# Patient Record
Sex: Male | Born: 1995 | Race: White | Hispanic: No | Marital: Single | Smoking: Never smoker
Health system: Southern US, Community
[De-identification: ages and names within clinical notes are randomized; demographics above are authoritative.]

## PROBLEM LIST (undated history)

## (undated) DIAGNOSIS — J45909 Unspecified asthma, uncomplicated: Secondary | ICD-10-CM

## (undated) DIAGNOSIS — R569 Unspecified convulsions: Secondary | ICD-10-CM

## (undated) HISTORY — DX: Unspecified asthma, uncomplicated: J45.909

## (undated) HISTORY — DX: Unspecified convulsions: R56.9

---
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
## 2018-06-05 ENCOUNTER — Ambulatory Visit: Payer: Self-pay | Admitting: Allergy and Immunology

## 2018-06-12 ENCOUNTER — Ambulatory Visit: Payer: Self-pay | Admitting: Allergy and Immunology

## 2019-03-06 ENCOUNTER — Encounter: Payer: Self-pay | Admitting: Allergy and Immunology

## 2019-03-06 ENCOUNTER — Ambulatory Visit (INDEPENDENT_AMBULATORY_CARE_PROVIDER_SITE_OTHER): Payer: Commercial Managed Care - PPO | Admitting: Allergy and Immunology

## 2019-03-06 ENCOUNTER — Other Ambulatory Visit: Payer: Self-pay

## 2019-03-06 VITALS — BP 122/80 | HR 100 | Temp 98.0°F | Resp 20 | Ht 70.9 in | Wt 137.0 lb

## 2019-03-06 DIAGNOSIS — J309 Allergic rhinitis, unspecified: Secondary | ICD-10-CM | POA: Diagnosis not present

## 2019-03-06 DIAGNOSIS — J4551 Severe persistent asthma with (acute) exacerbation: Secondary | ICD-10-CM

## 2019-03-06 MED ORDER — IPRATROPIUM-ALBUTEROL 0.5-2.5 (3) MG/3ML IN SOLN: RESPIRATORY_TRACT | 1 refills | Status: DC

## 2019-03-06 MED ORDER — IPRATROPIUM-ALBUTEROL 0.5-2.5 (3) MG/3ML IN SOLN: 3.0000 mL | Freq: Once | RESPIRATORY_TRACT | Status: DC

## 2019-03-06 MED ORDER — BUDESONIDE-FORMOTEROL FUMARATE 160-4.5 MCG/ACT IN AERO: INHALATION_SPRAY | RESPIRATORY_TRACT | 5 refills | Status: DC

## 2019-03-06 MED ORDER — MONTELUKAST SODIUM 10 MG PO TABS: ORAL_TABLET | ORAL | 5 refills | Status: DC

## 2019-03-06 MED ORDER — ALBUTEROL SULFATE HFA 108 (90 BASE) MCG/ACT IN AERS: INHALATION_SPRAY | RESPIRATORY_TRACT | 1 refills | Status: DC

## 2019-03-06 NOTE — Patient Instructions (Addendum)
  1.  Symbicort 160-2 inhalations twice a day with spacer  2.  Montelukast 10- 1 tablet once a day  3.  OTC Nasacort-1 spray each nostril once a day  4.  Prednisone 40 mg now, 20 mg daily for 14 days  5.  If needed: Albuterol HFA-2 inhalations every 4-6 hours  6.  If needed: DuoNeb every 4-6 hours  7.  Blood: CBC with differential, area 2 aeroallergen profile; Biological agent?  8. Return in 2 weeks or earlier if problem

## 2019-03-06 NOTE — Progress Notes (Signed)
[place] - [place]   NEW PATIENT NOTE  Referring Provider: No ref. provider found Primary Provider: No primary care provider on file. Date of office visit: 03/06/2019    Subjective:   Chief Complaint:  Steven Golden (DOB: [birth year]) is a 23 y.o. male who presents to the clinic on 03/06/2019 with a chief complaint of Asthma .  HPI: Steven Golden presents to this clinic in evaluation of asthma and allergic rhinitis.  He had been seen in this clinic approximately 5 years ago for similar issues.  He has had a very difficult time with his asthma over the course of the past 6 months or so especially over the course of the past several months.  He has constant wheezing and coughing and shortness of breath and nocturnal bronchospastic symptoms and uses a bronchodilator multiple times per day.  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  He does not use a mask.  He also has issues with sneezing and nasal congestion and nose blowing on a longstanding basis.  Occasionally his skin will get intensely itchy and developed red spots if he is exposed to grass directly applied to his skin such as weed eating.  It does not sound as though he has used a systemic steroid in the past 6 months to treat a respiratory tract issue.  It does not sound as though he has used a antibiotic to treat a respiratory tract issue.  Past Medical History:  Diagnosis Date  . Asthma   . Seizures ([place])     History reviewed. No pertinent surgical history.  Allergies as of 03/06/2019   No Known Allergies     Medication List      albuterol 108 (90 Base) MCG/ACT inhaler Commonly known as: ProAir HFA Use two puffs every 4-6 hours if needed for cough or wheeze. May use prior to exercise if needed.      Carbatrol 300 MG 12 hr capsule Generic drug: carbamazepine TAKE 2 CAPSULES BY MOUTH EVERY MORNING AND TAKE 3 CAPSULES EVERY EVENING             Review of systems negative except as noted in HPI / PMHx or noted below:  Review of Systems  Constitutional: Negative.   HENT: Negative.   Eyes: Negative.   Respiratory: Negative.   Cardiovascular: Negative.   Gastrointestinal: Negative.   Genitourinary: Negative.   Musculoskeletal: Negative.   Skin: Negative.   Neurological: Negative.   Endo/Heme/Allergies: Negative.   Psychiatric/Behavioral: Negative.     Family History  Problem Relation Age of Onset  . Asthma Mother   . High blood pressure Father   . Asthma Sister     Social History   Socioeconomic History  . Marital status: Single    Spouse name: Not on file  . Number of children: Not on file  . Years of education: Not on file  . Highest education level: Not on file  Occupational History  . Not on file  Social Needs  . Financial resource strain: Not on file  . Food insecurity    Worry: Not on file    Inability: Not on file  . Transportation needs    Medical: Not on file    Non-medical: Not on file  Tobacco Use  . Smoking status: Never Smoker  . Smokeless tobacco: Current User    Types: Chew  Substance and Sexual Activity  . Alcohol  use: Not Currently  . Drug use: Yes    Types: Marijuana  . Sexual activity: Not on file  Lifestyle  . Physical activity    Days per week: Not on file    Minutes per session: Not on file  . Stress: Not on file  Relationships  . Social Herbalist on phone: Not on file    Gets together: Not on file    Attends religious service: Not on file    Active member of club or organization: Not on file    Attends meetings of clubs or organizations: Not on file    Relationship status: Not on file  . Intimate partner violence    Fear of current or ex partner: Not on file    Emotionally abused: Not on file    Physically abused: Not on file    Forced sexual activity: Not on file  Other Topics Concern  . Not on file  Social History Narrative  . Not on  file    Environmental and Social history  Lives in a house with a dry environment, dogs located in the house, carpet in the bedroom, plastic on the bed and pillow, and no smoking inside the household. He works at a [employer].  Objective:   Vitals:   03/06/19 1414  BP: 122/80  Pulse: 100  Resp: 20  Temp: 98 F (36.7 C)  SpO2: 97%   Height: 5' 10.9" (180.1 cm) Weight: 137 lb (62.1 kg)  Physical Exam Constitutional:      Appearance: He is not diaphoretic.  HENT:     Head: Normocephalic. No right periorbital erythema or left periorbital erythema.     Right Ear: Tympanic membrane, ear canal and external ear normal.     Left Ear: Tympanic membrane, ear canal and external ear normal.     Nose: Nose normal. No mucosal edema or rhinorrhea.     Mouth/Throat:     Pharynx: No oropharyngeal exudate.  Eyes:     General: Lids are normal.     Conjunctiva/sclera: Conjunctivae normal.     Pupils: Pupils are equal, round, and reactive to light.  Neck:     Thyroid: No thyromegaly.     Trachea: Trachea normal. No tracheal deviation.  Cardiovascular:     Rate and Rhythm: Normal rate and regular rhythm.     Heart sounds: Normal heart sounds, S1 normal and S2 normal. No murmur.  Pulmonary:     Effort: Pulmonary effort is normal. No respiratory distress.     Breath sounds: No stridor. Wheezing (Bilateral inspiratory and expiratory wheezing throughout all lung fields) present. No rales.  Chest:     Chest wall: No tenderness.  Abdominal:     General: There is no distension.     Palpations: Abdomen is soft. There is no mass.     Tenderness: There is no abdominal tenderness. There is no guarding or rebound.  Musculoskeletal:        General: No tenderness.  Lymphadenopathy:     Head:     Right side of head: No tonsillar adenopathy.     Left side of head: No tonsillar adenopathy.     Cervical: No cervical adenopathy.  Skin:    Coloration: Skin is not pale.     Findings: No erythema or rash.      Nails: There is no clubbing.   Neurological:     Mental Status: He is alert.     Diagnostics: Allergy skin tests  were not performed.   Spirometry was performed and demonstrated an FEV1 of 1.45 @ 31 % of predicted. FEV1/FVC = 0.51  Assessment and Plan:    1. Poorly controlled severe persistent asthma with acute exacerbation   2. Allergic rhinitis, unspecified seasonality, unspecified trigger     1.  Symbicort 160-2 inhalations twice a day with spacer  2.  Montelukast 10- 1 tablet once a day  3.  OTC Nasacort-1 spray each nostril once a day  4.  Prednisone 40 mg now, 20 mg daily for 14 days  5.  If needed: Albuterol HFA-2 inhalations every 4-6 hours  6.  If needed: DuoNeb every 4-6 hours  7.  Blood: CBC with differential, area 2 aeroallergen profile; Biological agent?  8. Return in 2 weeks or earlier if problem  Steven Golden appears to have very active inflammation of his respiratory track and we will treat him with a collection of anti-inflammatory agents as noted above and see if he is a candidate for a biological agent.  I also had a talk with him today about the need to use a mask while he is at work at [employer].  I will see him back in this clinic in 2 weeks or earlier if there is a problem.  Allena Katz, MD Allergy / Immunology [place]

## 2019-03-10 ENCOUNTER — Encounter: Payer: Self-pay | Admitting: Allergy and Immunology

## 2019-03-20 ENCOUNTER — Ambulatory Visit: Payer: Commercial Managed Care - PPO | Admitting: Allergy and Immunology

## 2019-04-28 ENCOUNTER — Other Ambulatory Visit: Payer: Self-pay | Admitting: Allergy and Immunology

## 2019-05-20 ENCOUNTER — Other Ambulatory Visit: Payer: Self-pay

## 2019-05-20 MED ORDER — SYMBICORT 160-4.5 MCG/ACT IN AERO: INHALATION_SPRAY | RESPIRATORY_TRACT | 4 refills | Status: DC

## 2019-08-18 ENCOUNTER — Telehealth: Payer: Self-pay

## 2019-08-18 MED ORDER — ALBUTEROL SULFATE HFA 108 (90 BASE) MCG/ACT IN AERS: 1.0000 | INHALATION_SPRAY | Freq: Four times a day (QID) | RESPIRATORY_TRACT | 1 refills | Status: DC | PRN

## 2019-08-18 NOTE — Telephone Encounter (Signed)
Please inform patient that he should be using the following especially while infected with Covid:  1.  Symbicort 160-2 inhalations twice a day with spacer   2.  Montelukast 10- 1 tablet once a day   3.  OTC Nasacort-1 spray each nostril once a day  4.  Albuterol HFA - 2 inhalations every 4-6 hours if needed  Please provide him what ever refills he needs for these medications.

## 2019-08-18 NOTE — Telephone Encounter (Signed)
Patient called and stated he was just recently diagnosed with COVID and wanted refill on albuterol. Patient aware of risk with COVID.

## 2019-08-18 NOTE — Telephone Encounter (Signed)
Called patient and informed of refill being sent in and informed of the medication regimen to follow from Dr. Neldon Mc. Patient verbalized understanding.

## 2019-11-19 ENCOUNTER — Other Ambulatory Visit: Payer: Self-pay

## 2019-11-19 ENCOUNTER — Ambulatory Visit (INDEPENDENT_AMBULATORY_CARE_PROVIDER_SITE_OTHER): Payer: Commercial Managed Care - PPO | Admitting: Allergy and Immunology

## 2019-11-19 ENCOUNTER — Encounter: Payer: Self-pay | Admitting: Allergy and Immunology

## 2019-11-19 VITALS — BP 132/74 | HR 104 | Temp 98.3°F | Resp 12 | Ht 71.0 in | Wt 162.8 lb

## 2019-11-19 DIAGNOSIS — J4551 Severe persistent asthma with (acute) exacerbation: Secondary | ICD-10-CM | POA: Diagnosis not present

## 2019-11-19 DIAGNOSIS — J3089 Other allergic rhinitis: Secondary | ICD-10-CM

## 2019-11-19 MED ORDER — MONTELUKAST SODIUM 10 MG PO TABS: ORAL_TABLET | ORAL | 5 refills | Status: DC

## 2019-11-19 MED ORDER — SYMBICORT 160-4.5 MCG/ACT IN AERO: INHALATION_SPRAY | RESPIRATORY_TRACT | 5 refills | Status: DC

## 2019-11-19 MED ORDER — IPRATROPIUM-ALBUTEROL 0.5-2.5 (3) MG/3ML IN SOLN: RESPIRATORY_TRACT | 1 refills | Status: AC

## 2019-11-19 MED ORDER — ALBUTEROL SULFATE HFA 108 (90 BASE) MCG/ACT IN AERS: INHALATION_SPRAY | RESPIRATORY_TRACT | 1 refills | Status: DC

## 2019-11-19 NOTE — Progress Notes (Signed)
[place] - [place] - [place]   Follow-up Note  Referring Provider: No ref. provider found Primary Provider: Patient, No Pcp Per Date of Office Visit: 11/19/2019  Subjective:   Steven Golden (DOB: May 09, 1996) is a 24 y.o. male who returns to the [facility] [place] on 11/19/2019 in re-evaluation of the following:  HPI: Raedyn returns to this clinic in evaluation of asthma and allergic rhinitis.  His last visit to this clinic was 06 March 2019.  During his last visit he had a significant exacerbation of his asthma for which we restarted him on anti-inflammatory medications and gave him a systemic steroid.  He did well through the summer and and fall and winter.  He ran out of his medications about a month ago.  He has now developed wheezing and coughing and must use a nebulizer 3 times per day especially at nighttime.  He does not have any chest pain or ugly sputum production or recurrent fever and he has not had any significant issues involving his nose.  It does not sound as though he has required any additional systemic steroids or antibiotics for an airway issue since I have last seen him in this clinic.  Allergies as of 11/19/2019   No Known Allergies     Medication List    Carbatrol 300 MG 12 hr capsule Generic drug: carbamazepine TAKE 2 CAPSULES BY MOUTH EVERY MORNING AND TAKE 3 CAPSULES EVERY EVENING   ipratropium-albuterol 0.5-2.5 (3) MG/3ML Soln Commonly known as: DUONEB Use one vial in the nebulizer every 4-6 hours if needed for cough or wheeze.   levocetirizine 5 MG tablet Commonly known as: XYZAL Take 5 mg by mouth daily as needed.       Past Medical History:  Diagnosis Date  . Asthma   . Seizures ([place])     History reviewed. No pertinent surgical history.  Review of systems negative except as noted in HPI / PMHx or noted below:  Review of Systems  Constitutional: Negative.   HENT: Negative.   Eyes: Negative.     Respiratory: Negative.   Cardiovascular: Negative.   Gastrointestinal: Negative.   Genitourinary: Negative.   Musculoskeletal: Negative.   Skin: Negative.   Neurological: Negative.   Endo/Heme/Allergies: Negative.   Psychiatric/Behavioral: Negative.      Objective:   Vitals:   11/19/19 1344  BP: 132/74  Pulse: (!) 104  Resp: 12  Temp: 98.3 F (36.8 C)  SpO2: 96%   Height: 5\' 11"  (180.3 cm)  Weight: 162 lb 12.8 oz (73.8 kg)   Physical Exam Constitutional:      Appearance: He is not diaphoretic.  HENT:     Head: Normocephalic.     Right Ear: Tympanic membrane, ear canal and external ear normal.     Left Ear: Tympanic membrane, ear canal and external ear normal.     Nose: Nose normal. No mucosal edema or rhinorrhea.     Mouth/Throat:     Pharynx: Uvula midline. No oropharyngeal exudate.  Eyes:     Conjunctiva/sclera: Conjunctivae normal.  Neck:     Thyroid: No thyromegaly.     Trachea: Trachea normal. No tracheal tenderness or tracheal deviation.  Cardiovascular:     Rate and Rhythm: Normal rate and regular rhythm.     Heart sounds: Normal heart sounds, S1 normal and S2 normal. No murmur.  Pulmonary:     Effort: No respiratory distress.     Breath sounds: Normal breath sounds. No  stridor. No wheezing or rales.  Lymphadenopathy:     Head:     Right side of head: No tonsillar adenopathy.     Left side of head: No tonsillar adenopathy.     Cervical: No cervical adenopathy.  Skin:    Findings: No erythema or rash.     Nails: There is no clubbing.  Neurological:     Mental Status: He is alert.     Diagnostics:    Spirometry was performed and demonstrated an FEV1 of 1.89 at 40 % of predicted.  Assessment and Plan:   1. Poorly controlled severe persistent asthma with acute exacerbation   2. Other allergic rhinitis     1.  Symbicort 160-2 inhalations twice a day with spacer  2.  Montelukast 10- 1 tablet once a day   3.  OTC Nasacort-1 spray each  nostril once a day  4.  Prednisone 20 mg now, 20 mg daily for 7 days  5.  If needed: Albuterol HFA-2 inhalations every 4-6 hours  6.  If needed: DuoNeb every 4-6 hours  7.  Obtain Covid vaccine  8.  Return in 12 weeks or earlier if problem   Unfortunately for [facility], having a combination of pollen exposure and running out of his medications has resulted in very significant inflammation of his airway.  We will treat him with the therapy noted above and assume he will respond appropriately and see him back in his clinic in 12 weeks or earlier if there is a problem.  Allena Katz, MD Allergy / Immunology [place]

## 2019-11-19 NOTE — Patient Instructions (Addendum)
  1.  Symbicort 160-2 inhalations twice a day with spacer  2.  Montelukast 10- 1 tablet once a day   3.  OTC Nasacort-1 spray each nostril once a day  4.  Prednisone 20 mg now, 20 mg daily for 7 days  5.  If needed: Albuterol HFA-2 inhalations every 4-6 hours  6.  If needed: DuoNeb every 4-6 hours  7.  Obtain Covid vaccine  8.  Return in 12 weeks or earlier if problem

## 2019-11-20 ENCOUNTER — Encounter: Payer: Self-pay | Admitting: Allergy and Immunology

## 2020-01-18 ENCOUNTER — Other Ambulatory Visit: Payer: Self-pay | Admitting: Allergy and Immunology

## 2020-01-24 ENCOUNTER — Inpatient Hospital Stay (HOSPITAL_COMMUNITY)
Admission: EM | Admit: 2020-01-24 | Discharge: 2020-01-30 | DRG: 871 | Disposition: A | Discharge status: Home / Self Care | Payer: Commercial Managed Care - PPO | Attending: Internal Medicine | Admitting: Internal Medicine

## 2020-01-24 ENCOUNTER — Emergency Department (HOSPITAL_COMMUNITY): Payer: Commercial Managed Care - PPO

## 2020-01-24 ENCOUNTER — Encounter (HOSPITAL_COMMUNITY): Payer: Self-pay | Admitting: Emergency Medicine

## 2020-01-24 ENCOUNTER — Other Ambulatory Visit: Payer: Self-pay

## 2020-01-24 DIAGNOSIS — J189 Pneumonia, unspecified organism: Secondary | ICD-10-CM | POA: Diagnosis present

## 2020-01-24 DIAGNOSIS — Z20822 Contact with and (suspected) exposure to covid-19: Secondary | ICD-10-CM | POA: Diagnosis present

## 2020-01-24 DIAGNOSIS — Z825 Family history of asthma and other chronic lower respiratory diseases: Secondary | ICD-10-CM | POA: Diagnosis not present

## 2020-01-24 DIAGNOSIS — F17220 Nicotine dependence, chewing tobacco, uncomplicated: Secondary | ICD-10-CM | POA: Diagnosis present

## 2020-01-24 DIAGNOSIS — F1290 Cannabis use, unspecified, uncomplicated: Secondary | ICD-10-CM | POA: Diagnosis present

## 2020-01-24 DIAGNOSIS — Z8701 Personal history of pneumonia (recurrent): Secondary | ICD-10-CM

## 2020-01-24 DIAGNOSIS — G40909 Epilepsy, unspecified, not intractable, without status epilepticus: Secondary | ICD-10-CM

## 2020-01-24 DIAGNOSIS — R112 Nausea with vomiting, unspecified: Secondary | ICD-10-CM | POA: Diagnosis present

## 2020-01-24 DIAGNOSIS — Z79899 Other long term (current) drug therapy: Secondary | ICD-10-CM

## 2020-01-24 DIAGNOSIS — D689 Coagulation defect, unspecified: Secondary | ICD-10-CM | POA: Diagnosis present

## 2020-01-24 DIAGNOSIS — J690 Pneumonitis due to inhalation of food and vomit: Secondary | ICD-10-CM | POA: Diagnosis present

## 2020-01-24 DIAGNOSIS — R197 Diarrhea, unspecified: Secondary | ICD-10-CM | POA: Diagnosis not present

## 2020-01-24 DIAGNOSIS — Z7951 Long term (current) use of inhaled steroids: Secondary | ICD-10-CM

## 2020-01-24 DIAGNOSIS — E876 Hypokalemia: Secondary | ICD-10-CM | POA: Diagnosis present

## 2020-01-24 DIAGNOSIS — J159 Unspecified bacterial pneumonia: Secondary | ICD-10-CM | POA: Diagnosis present

## 2020-01-24 DIAGNOSIS — I4510 Unspecified right bundle-branch block: Secondary | ICD-10-CM | POA: Diagnosis present

## 2020-01-24 DIAGNOSIS — D649 Anemia, unspecified: Secondary | ICD-10-CM | POA: Diagnosis present

## 2020-01-24 DIAGNOSIS — F419 Anxiety disorder, unspecified: Secondary | ICD-10-CM | POA: Diagnosis present

## 2020-01-24 DIAGNOSIS — J9601 Acute respiratory failure with hypoxia: Secondary | ICD-10-CM | POA: Diagnosis present

## 2020-01-24 DIAGNOSIS — Z8616 Personal history of COVID-19: Secondary | ICD-10-CM

## 2020-01-24 DIAGNOSIS — A419 Sepsis, unspecified organism: Principal | ICD-10-CM | POA: Diagnosis present

## 2020-01-24 DIAGNOSIS — G40109 Localization-related (focal) (partial) symptomatic epilepsy and epileptic syndromes with simple partial seizures, not intractable, without status epilepticus: Secondary | ICD-10-CM | POA: Diagnosis present

## 2020-01-24 DIAGNOSIS — Q8589 Other phakomatoses, not elsewhere classified: Secondary | ICD-10-CM

## 2020-01-24 DIAGNOSIS — Q858 Other phakomatoses, not elsewhere classified: Secondary | ICD-10-CM

## 2020-01-24 DIAGNOSIS — J45901 Unspecified asthma with (acute) exacerbation: Secondary | ICD-10-CM | POA: Diagnosis present

## 2020-01-24 DIAGNOSIS — A409 Streptococcal sepsis, unspecified: Secondary | ICD-10-CM

## 2020-01-24 DIAGNOSIS — D473 Essential (hemorrhagic) thrombocythemia: Secondary | ICD-10-CM | POA: Diagnosis not present

## 2020-01-24 LAB — CBC
HCT: 36.2 % — ABNORMAL LOW (ref 39.0–52.0)
Hemoglobin: 12.1 g/dL — ABNORMAL LOW (ref 13.0–17.0)
MCH: 32.4 pg (ref 26.0–34.0)
MCHC: 33.4 g/dL (ref 30.0–36.0)
MCV: 96.8 fL (ref 80.0–100.0)
Platelets: 390 10*3/uL (ref 150–400)
RBC: 3.74 MIL/uL — ABNORMAL LOW (ref 4.22–5.81)
RDW: 11 % — ABNORMAL LOW (ref 11.5–15.5)
WBC: 9.7 10*3/uL (ref 4.0–10.5)
nRBC: 0 % (ref 0.0–0.2)

## 2020-01-24 LAB — ABO/RH: ABO/RH(D): A POS

## 2020-01-24 LAB — URINALYSIS, ROUTINE W REFLEX MICROSCOPIC
Bacteria, UA: NONE SEEN
Bilirubin Urine: NEGATIVE
Glucose, UA: NEGATIVE mg/dL
Hgb urine dipstick: NEGATIVE
Ketones, ur: 20 mg/dL — AB
Leukocytes,Ua: NEGATIVE
Nitrite: NEGATIVE
Protein, ur: 100 mg/dL — AB
Specific Gravity, Urine: 1.029 (ref 1.005–1.030)
pH: 5 (ref 5.0–8.0)

## 2020-01-24 LAB — BASIC METABOLIC PANEL
Anion gap: 12 (ref 5–15)
BUN: 11 mg/dL (ref 6–20)
CO2: 25 mmol/L (ref 22–32)
Calcium: 8.8 mg/dL — ABNORMAL LOW (ref 8.9–10.3)
Chloride: 101 mmol/L (ref 98–111)
Creatinine, Ser: 0.97 mg/dL (ref 0.61–1.24)
GFR calc Af Amer: >60 mL/min (ref >60)
GFR calc non Af Amer: >60 mL/min (ref >60)
Glucose, Bld: 116 mg/dL — ABNORMAL HIGH (ref 70–99)
Potassium: 3.3 mmol/L — ABNORMAL LOW (ref 3.5–5.1)
Sodium: 138 mmol/L (ref 135–145)

## 2020-01-24 LAB — RAPID URINE DRUG SCREEN, HOSP PERFORMED
Amphetamines: NOT DETECTED
Barbiturates: NOT DETECTED
Benzodiazepines: NOT DETECTED
Cocaine: NOT DETECTED
Opiates: POSITIVE — AB
Tetrahydrocannabinol: POSITIVE — AB

## 2020-01-24 LAB — HEPATIC FUNCTION PANEL
ALT: 38 U/L (ref 0–44)
AST: 25 U/L (ref 15–41)
Albumin: 2.5 g/dL — ABNORMAL LOW (ref 3.5–5.0)
Alkaline Phosphatase: 84 U/L (ref 38–126)
Bilirubin, Direct: 0.7 mg/dL — ABNORMAL HIGH (ref 0.0–0.2)
Indirect Bilirubin: 1 mg/dL — ABNORMAL HIGH (ref 0.3–0.9)
Total Bilirubin: 1.7 mg/dL — ABNORMAL HIGH (ref 0.3–1.2)
Total Protein: 6.8 g/dL (ref 6.5–8.1)

## 2020-01-24 LAB — FIBRINOGEN: Fibrinogen: >800 mg/dL — ABNORMAL HIGH (ref 210–475)

## 2020-01-24 LAB — RAPID HIV SCREEN (HIV 1/2 AB+AG)
HIV 1/2 Antibodies: NONREACTIVE
HIV-1 P24 Antigen - HIV24: NONREACTIVE

## 2020-01-24 LAB — SARS CORONAVIRUS 2 BY RT PCR (HOSPITAL ORDER, PERFORMED IN ~~LOC~~ HOSPITAL LAB): SARS Coronavirus 2: NEGATIVE

## 2020-01-24 LAB — APTT: aPTT: 38 seconds — ABNORMAL HIGH (ref 24–36)

## 2020-01-24 LAB — C-REACTIVE PROTEIN: CRP: 28.6 mg/dL — ABNORMAL HIGH (ref <1.0)

## 2020-01-24 LAB — FERRITIN: Ferritin: 969 ng/mL — ABNORMAL HIGH (ref 24–336)

## 2020-01-24 LAB — INFLUENZA PANEL BY PCR (TYPE A & B)
Influenza A By PCR: NEGATIVE
Influenza B By PCR: NEGATIVE

## 2020-01-24 LAB — HEPATITIS PANEL, ACUTE
HCV Ab: NONREACTIVE
Hep A IgM: NONREACTIVE
Hep B C IgM: NONREACTIVE
Hepatitis B Surface Ag: NONREACTIVE

## 2020-01-24 LAB — PROTIME-INR
INR: 1.6 — ABNORMAL HIGH (ref 0.8–1.2)
Prothrombin Time: 18.6 seconds — ABNORMAL HIGH (ref 11.4–15.2)

## 2020-01-24 LAB — STREP PNEUMONIAE URINARY ANTIGEN: Strep Pneumo Urinary Antigen: NEGATIVE

## 2020-01-24 LAB — HIV ANTIBODY (ROUTINE TESTING W REFLEX): HIV Screen 4th Generation wRfx: NONREACTIVE

## 2020-01-24 LAB — CARBAMAZEPINE LEVEL, TOTAL: Carbamazepine Lvl: 14.2 ug/mL — ABNORMAL HIGH (ref 4.0–12.0)

## 2020-01-24 LAB — POC SARS CORONAVIRUS 2 AG -  ED: SARS Coronavirus 2 Ag: NEGATIVE

## 2020-01-24 LAB — D-DIMER, QUANTITATIVE: D-Dimer, Quant: 1.55 ug/mL-FEU — ABNORMAL HIGH (ref 0.00–0.50)

## 2020-01-24 LAB — LACTIC ACID, PLASMA
Lactic Acid, Venous: 1 mmol/L (ref 0.5–1.9)
Lactic Acid, Venous: 1 mmol/L (ref 0.5–1.9)

## 2020-01-24 LAB — PROCALCITONIN: Procalcitonin: 0.73 ng/mL

## 2020-01-24 LAB — LACTATE DEHYDROGENASE: LDH: 222 U/L — ABNORMAL HIGH (ref 98–192)

## 2020-01-24 LAB — TROPONIN I (HIGH SENSITIVITY): Troponin I (High Sensitivity): 3 ng/L (ref <18)

## 2020-01-24 MED ORDER — SODIUM CHLORIDE 0.9 % IV SOLN
2.0000 g | Freq: Three times a day (TID) | INTRAVENOUS | Status: DC
  Administered 2020-01-24 – 2020-01-30 (×17): 2 g via INTRAVENOUS
  Filled 2020-01-24 (×20): qty 2

## 2020-01-24 MED ORDER — CARBAMAZEPINE ER 200 MG PO CP12
900.0000 mg | ORAL_CAPSULE | Freq: Every day | ORAL | Status: DC
  Filled 2020-01-24: qty 1

## 2020-01-24 MED ORDER — VANCOMYCIN HCL IN DEXTROSE 1-5 GM/200ML-% IV SOLN: 1000.0000 mg | Freq: Once | INTRAVENOUS | Status: DC

## 2020-01-24 MED ORDER — METHYLPREDNISOLONE SODIUM SUCC 125 MG IJ SOLR
125.0000 mg | INTRAMUSCULAR | Status: AC
  Administered 2020-01-24: 125 mg via INTRAVENOUS
  Filled 2020-01-24: qty 2

## 2020-01-24 MED ORDER — BUDESONIDE 0.5 MG/2ML IN SUSP
0.5000 mg | Freq: Two times a day (BID) | RESPIRATORY_TRACT | Status: DC
  Administered 2020-01-24 – 2020-01-30 (×13): 0.5 mg via RESPIRATORY_TRACT
  Filled 2020-01-24 (×14): qty 2

## 2020-01-24 MED ORDER — LEVALBUTEROL HCL 0.63 MG/3ML IN NEBU
0.6300 mg | INHALATION_SOLUTION | RESPIRATORY_TRACT | Status: DC | PRN
  Administered 2020-01-24 – 2020-01-26 (×4): 0.63 mg via RESPIRATORY_TRACT
  Filled 2020-01-24 (×2): qty 3

## 2020-01-24 MED ORDER — CARBAMAZEPINE ER 300 MG PO CP12: 600.0000 mg | ORAL_CAPSULE | ORAL | Status: DC

## 2020-01-24 MED ORDER — CARBAMAZEPINE ER 200 MG PO TB12
600.0000 mg | ORAL_TABLET | Freq: Every day | ORAL | Status: DC
  Administered 2020-01-25 – 2020-01-30 (×6): 600 mg via ORAL
  Filled 2020-01-24 (×6): qty 3

## 2020-01-24 MED ORDER — ARFORMOTEROL TARTRATE 15 MCG/2ML IN NEBU
15.0000 ug | INHALATION_SOLUTION | Freq: Two times a day (BID) | RESPIRATORY_TRACT | Status: DC
  Administered 2020-01-24 – 2020-01-30 (×12): 15 ug via RESPIRATORY_TRACT
  Filled 2020-01-24 (×14): qty 2

## 2020-01-24 MED ORDER — METRONIDAZOLE IN NACL 5-0.79 MG/ML-% IV SOLN
500.0000 mg | Freq: Once | INTRAVENOUS | Status: AC
  Administered 2020-01-24: 500 mg via INTRAVENOUS
  Filled 2020-01-24: qty 100

## 2020-01-24 MED ORDER — METHYLPREDNISOLONE SODIUM SUCC 125 MG IJ SOLR
60.0000 mg | Freq: Three times a day (TID) | INTRAMUSCULAR | Status: AC
  Administered 2020-01-24 – 2020-01-25 (×4): 60 mg via INTRAVENOUS
  Filled 2020-01-24 (×4): qty 2

## 2020-01-24 MED ORDER — LORATADINE 10 MG PO TABS: 10.0000 mg | ORAL_TABLET | Freq: Every day | ORAL | Status: DC | PRN

## 2020-01-24 MED ORDER — HYDROCOD POLST-CPM POLST ER 10-8 MG/5ML PO SUER
5.0000 mL | Freq: Two times a day (BID) | ORAL | Status: DC | PRN
  Administered 2020-01-26 – 2020-01-28 (×4): 5 mL via ORAL
  Filled 2020-01-24 (×4): qty 5

## 2020-01-24 MED ORDER — CARBAMAZEPINE ER 200 MG PO CP12
600.0000 mg | ORAL_CAPSULE | Freq: Every day | ORAL | Status: DC
  Filled 2020-01-24: qty 3

## 2020-01-24 MED ORDER — ONDANSETRON HCL 4 MG/2ML IJ SOLN
4.0000 mg | Freq: Once | INTRAMUSCULAR | Status: AC
  Administered 2020-01-24: 4 mg via INTRAVENOUS
  Filled 2020-01-24: qty 2

## 2020-01-24 MED ORDER — POTASSIUM CHLORIDE 10 MEQ/100ML IV SOLN
10.0000 meq | INTRAVENOUS | Status: AC
  Administered 2020-01-24 (×2): 10 meq via INTRAVENOUS
  Filled 2020-01-24 (×2): qty 100

## 2020-01-24 MED ORDER — PREDNISONE 20 MG PO TABS
40.0000 mg | ORAL_TABLET | Freq: Every day | ORAL | Status: AC
  Administered 2020-01-26 – 2020-01-28 (×3): 40 mg via ORAL
  Filled 2020-01-24 (×3): qty 2

## 2020-01-24 MED ORDER — CALCIUM GLUCONATE-NACL 1-0.675 GM/50ML-% IV SOLN
1.0000 g | Freq: Once | INTRAVENOUS | Status: AC
  Administered 2020-01-24: 1000 mg via INTRAVENOUS
  Filled 2020-01-24: qty 50

## 2020-01-24 MED ORDER — LEVALBUTEROL HCL 0.63 MG/3ML IN NEBU
1.2500 mg | INHALATION_SOLUTION | Freq: Four times a day (QID) | RESPIRATORY_TRACT | Status: DC
  Administered 2020-01-24 – 2020-01-26 (×8): 1.25 mg via RESPIRATORY_TRACT
  Filled 2020-01-24 (×10): qty 6

## 2020-01-24 MED ORDER — POTASSIUM CHLORIDE 10 MEQ/100ML IV SOLN
10.0000 meq | INTRAVENOUS | Status: AC
  Administered 2020-01-24 (×2): 10 meq via INTRAVENOUS

## 2020-01-24 MED ORDER — ONDANSETRON HCL 4 MG/2ML IJ SOLN
4.0000 mg | Freq: Four times a day (QID) | INTRAMUSCULAR | Status: DC | PRN
  Administered 2020-01-24 – 2020-01-27 (×6): 4 mg via INTRAVENOUS
  Filled 2020-01-24 (×7): qty 2

## 2020-01-24 MED ORDER — SODIUM CHLORIDE 0.9 % IV SOLN: INTRAVENOUS | Status: DC

## 2020-01-24 MED ORDER — VANCOMYCIN HCL 1500 MG/300ML IV SOLN
1500.0000 mg | Freq: Once | INTRAVENOUS | Status: AC
  Administered 2020-01-24: 1500 mg via INTRAVENOUS
  Filled 2020-01-24: qty 300

## 2020-01-24 MED ORDER — VANCOMYCIN HCL 1500 MG/300ML IV SOLN
1500.0000 mg | Freq: Two times a day (BID) | INTRAVENOUS | Status: DC
  Administered 2020-01-24 – 2020-01-25 (×2): 1500 mg via INTRAVENOUS
  Filled 2020-01-24 (×2): qty 300

## 2020-01-24 MED ORDER — GUAIFENESIN ER 600 MG PO TB12
600.0000 mg | ORAL_TABLET | Freq: Two times a day (BID) | ORAL | Status: DC
  Administered 2020-01-24 – 2020-01-30 (×12): 600 mg via ORAL
  Filled 2020-01-24 (×13): qty 1

## 2020-01-24 MED ORDER — SODIUM CHLORIDE 0.9 % IV SOLN
2.0000 g | Freq: Once | INTRAVENOUS | Status: AC
  Administered 2020-01-24: 2 g via INTRAVENOUS
  Filled 2020-01-24: qty 2

## 2020-01-24 MED ORDER — ENOXAPARIN SODIUM 40 MG/0.4ML ~~LOC~~ SOLN
40.0000 mg | SUBCUTANEOUS | Status: DC
  Administered 2020-01-25 – 2020-01-30 (×6): 40 mg via SUBCUTANEOUS
  Filled 2020-01-24 (×6): qty 0.4

## 2020-01-24 MED ORDER — LEVALBUTEROL HCL 0.63 MG/3ML IN NEBU: 1.2500 mg | INHALATION_SOLUTION | RESPIRATORY_TRACT | Status: DC | PRN

## 2020-01-24 MED ORDER — SODIUM CHLORIDE 0.9% FLUSH: 3.0000 mL | Freq: Once | INTRAVENOUS | Status: DC

## 2020-01-24 MED ORDER — POTASSIUM CHLORIDE CRYS ER 20 MEQ PO TBCR
40.0000 meq | EXTENDED_RELEASE_TABLET | ORAL | Status: DC
  Filled 2020-01-24: qty 2

## 2020-01-24 MED ORDER — CARBAMAZEPINE ER 200 MG PO TB12
900.0000 mg | ORAL_TABLET | Freq: Every day | ORAL | Status: DC
  Administered 2020-01-24 – 2020-01-29 (×6): 900 mg via ORAL
  Filled 2020-01-24 (×6): qty 1

## 2020-01-24 MED ORDER — IOHEXOL 350 MG/ML SOLN
100.0000 mL | Freq: Once | INTRAVENOUS | Status: AC | PRN
  Administered 2020-01-24: 100 mL via INTRAVENOUS

## 2020-01-24 NOTE — Progress Notes (Signed)
Pharmacy Antibiotic Note  Steven Golden is a 24 y.o. male admitted on 01/24/2020 with chest pain/SOB, poss PNA.  Pharmacy has been consulted for Vancomycin and Cefepime  Dosing.  Vancomycin 1500 mg IV given in ED at  0600  Plan: Vancomycin 1500 mg IV q12h Cefepime 2 g IV q8h  Height: 6' (182.9 cm) Weight: 80 kg (176 lb 5.9 oz) IBW/kg (Calculated) : 77.6  Temp (24hrs), Avg:100 F (37.8 C), Min:100 F (37.8 C), Max:100 F (37.8 C)  Recent Labs  Lab 01/24/20 0413 01/24/20 0416  WBC  --  9.7  CREATININE  --  0.97  LATICACIDVEN 1.0  --     Estimated Creatinine Clearance: 130 mL/min (by C-G formula based on SCr of 0.97 mg/dL).    Allergies  Allergen Reactions  . Azithromycin Other (See Comments)    Seizure medication interaction     Caryl Pina 01/24/2020 7:36 AM

## 2020-01-24 NOTE — ED Triage Notes (Signed)
Patient arrived with EMS from home reports central chest pain and SOB this week , currently taking antibiotic for pneumonia , no fever or chills. Patient received Ibuprofen 800 mg by EMS prior to arrival .

## 2020-01-24 NOTE — ED Notes (Signed)
Patient transported to CT 

## 2020-01-24 NOTE — ED Notes (Signed)
Called report:   

## 2020-01-24 NOTE — ED Notes (Signed)
Lunch tray ordered 

## 2020-01-24 NOTE — H&P (Addendum)
History and Physical    Steven Golden [id number] DOB: 1996/03/13 DOA: 01/24/2020  Referring MD/NP/PA: Gean Birchwood, MD PCP: Raina Mina., MD  Patient coming from: Home via EMS  Chief Complaint: Cough and shortness of breath  I have personally briefly reviewed patient's old medical records in [place]   HPI: Steven Golden is a 24 y.o. male with medical history significant of persistent asthma, Sturge-Weber, seizure disorder, and history of COVID-19 presents with complaints of progressively worsening cough and shortness of breath over the last week.  History is obtained from the patient's father due to his shortness of breath.  This is the patient's fourth visit since onset of symptoms.  Associated symptoms include fevers at home up to 102F, productive cough, wheezing, nausea, vomiting, chest pain from coughing, abdominal cramps, diarrhea, decreased p.o. intake, and anxiety.  He had been initially seen at urgent care and started on Augmentin and on subsequent visit had been given Levaquin along with antiemetics and prednisone.  Had been alternating Tylenol and ibuprofen for fever.  He had not been able to keep any significant amount of food, liquids, or even medications down at times.  His father notes that when he gets into these coughing spells he becomes more anxious and has worsening of his breathing.  Patient denies any history of any heart issues and denies any reports of IV drug use.  He had previously gotten over COVID-19 infection approximately 5 months ago.  En route with EMS patient was given 800 mg of ibuprofen prior to arrival.  ED Course: Upon admission into the emergency department patient was seen to have a temperature of 100 F, pulse 100-1 32, respirations 23-24, blood pressure stable, and O2 saturation maintained on 2-6 L nasal cannula oxygen.  Labs significant for WBC 9.7, hemoglobin 12.1, potassium 3.3, INR 1.6, D-dimer 1.55, CRP 28.6, 169, procalcitonin 0.73, and  lactic acid 1.  COVID-19 screening was negative.  UA was negative for signs of infection.  UDS positive for opiates and marijuana.  CT angiogram of the chest revealed signs of multifocal pneumonia with lower lobe predominance.  Blood cultures have been obtained.  Patient was started on empiric antibiotics of vancomycin, metronidazole, and cefepime.  TRH called to admit  Review of Systems  Constitutional: Positive for fever and malaise/fatigue.  HENT: Negative for ear discharge and nosebleeds.   Eyes: Negative for photophobia and pain.  Respiratory: Positive for cough, shortness of breath and wheezing.   Cardiovascular: Positive for chest pain. Negative for leg swelling.  Gastrointestinal: Positive for abdominal pain, nausea and vomiting.  Genitourinary: Negative for dysuria and hematuria.  Musculoskeletal: Positive for myalgias. Negative for joint pain.  Skin: Negative for rash.  Neurological: Positive for weakness. Negative for focal weakness and seizures.  Psychiatric/Behavioral: Positive for substance abuse. The patient does not have insomnia.     Past Medical History:  Diagnosis Date   Asthma    Seizures ([place])     History reviewed. No pertinent surgical history.   reports that he has never smoked. His smokeless tobacco use includes chew. He reports previous alcohol use. He reports current drug use. Drug: Marijuana.  Allergies  Allergen Reactions   Azithromycin Other (See Comments)    Seizure medication interaction    Family History  Problem Relation Age of Onset   Asthma Mother    High blood pressure Father    Asthma Sister     Prior to Admission medications   Medication Sig Start Date End Date Taking?  Authorizing Provider  albuterol (VENTOLIN HFA) 108 (90 Base) MCG/ACT inhaler Can inhale two puffs every four to six hours as needed for cough or wheeze. Patient taking differently: Inhale 2 puffs into the lungs every 4 (four) hours as needed for wheezing or shortness  of breath.  01/19/20  Yes Kozlow, Donnamarie Poag, MD  amoxicillin-clavulanate (AUGMENTIN) 500-125 MG tablet Take 1 tablet by mouth 3 (three) times daily. 01/18/20  Yes [provider]  CARBATROL 300 MG 12 hr capsule Take 600-900 mg by mouth See admin instructions. Take 2 capsules in the morning and take 3 capsules at night. 02/04/19  Yes [provider]  ibuprofen (ADVIL) 200 MG tablet Take 400-600 mg by mouth daily as needed for fever or moderate pain.   Yes [provider]  ipratropium-albuterol (DUONEB) 0.5-2.5 (3) MG/3ML SOLN Can use one vial in the nebulizer every 4-6 hours if needed for cough or wheeze. 11/19/19  Yes Kozlow, Donnamarie Poag, MD  levalbuterol Penne Lash) 1.25 MG/3ML nebulizer solution Inhale 3 mLs into the lungs every 4 (four) hours as needed for shortness of breath or wheezing. 01/22/20  Yes [provider]  levocetirizine (XYZAL) 5 MG tablet Take 5 mg by mouth daily as needed for allergies.    Yes [provider]  ondansetron (ZOFRAN) 4 MG tablet Take 4-8 mg by mouth every 8 (eight) hours as needed for nausea/vomiting. 01/21/20  Yes [provider]  predniSONE (DELTASONE) 20 MG tablet Take 1-3 tablets by mouth See admin instructions. Take 3 tablets for 2 days, then take 2 tablets for 2 days, then take 1 tablet for 2 days. 01/21/20  Yes [provider]  promethazine (PHENERGAN) 12.5 MG tablet Take 1 tablet by mouth every 12 (twelve) hours as needed for nausea/vomiting. 01/18/20  Yes [provider]  SYMBICORT 160-4.5 MCG/ACT inhaler Inhale two puffs twice daily to prevent cough or wheeze.  Rinse, gargle, and spit after use. 11/19/19  Yes Kozlow, Donnamarie Poag, MD  montelukast (SINGULAIR) 10 MG tablet Take one tablet once daily as directed. Patient not taking: Reported on 01/24/2020 11/19/19   Jiles Prows, MD    Physical Exam:  Constitutional: Young male who appears acutely ill Vitals:   01/24/20 0219 01/24/20 0223 01/24/20 0318  BP:   124/65 122/71  Pulse:  (!) 132 100  Resp:  (!) 23 (!) 24  Temp:  100 F (37.8 C)   TempSrc:  Oral   SpO2:  95% 96%  Weight: 80 kg    Height: 6' (1.829 m)     Eyes: PERRL, lids and conjunctivae normal ENMT: Mucous membranes are moist. Posterior pharynx clear of any exudate or lesions.Normal dentition.  Neck: normal, supple, no masses, no thyromegaly Respiratory: Tachypneic with decreased overall aeration and expiratory wheeze appreciated.  Patient currently on 4 L of oxygen with O2 saturations maintained.   Cardiovascular: Tach, no murmurs / rubs / gallops. No extremity edema. 2+ pedal pulses. No carotid bruits.  Abdomen: no tenderness, no masses palpated. No hepatosplenomegaly. Bowel sounds positive.  Musculoskeletal: no clubbing / cyanosis. No joint deformity upper and lower extremities. Good ROM, no contractures. Normal muscle tone.  Skin: no rashes, lesions, ulcers. No induration Neurologic: CN 2-12 grossly intact. Sensation intact, DTR normal. Strength 5/5 in all 4.  Psychiatric: Normal judgment and insight. Alert and oriented x 3.  Anxious mood.     Labs on Admission: I have personally reviewed following labs and imaging studies  CBC: Recent Labs  Lab 01/24/20 0416  WBC 9.7  HGB 12.1*  HCT 36.2*  MCV 96.8  PLT [id number]   Basic Metabolic Panel: Recent Labs  Lab 01/24/20 0416  NA 138  K 3.3*  CL 101  CO2 25  GLUCOSE 116*  BUN 11  CREATININE 0.97  CALCIUM 8.8*   GFR: Estimated Creatinine Clearance: 130 mL/min (by C-G formula based on SCr of 0.97 mg/dL). Liver Function Tests: No results for input(s): AST, ALT, ALKPHOS, BILITOT, PROT, ALBUMIN in the last 168 hours. No results for input(s): LIPASE, AMYLASE in the last 168 hours. No results for input(s): AMMONIA in the last 168 hours. Coagulation Profile: Recent Labs  Lab 01/24/20 0416  INR 1.6*   Cardiac Enzymes: No results for input(s): CKTOTAL, CKMB, CKMBINDEX, TROPONINI in the last 168 hours. BNP (last 3  results) No results for input(s): PROBNP in the last 8760 hours. HbA1C: No results for input(s): HGBA1C in the last 72 hours. CBG: No results for input(s): GLUCAP in the last 168 hours. Lipid Profile: No results for input(s): CHOL, HDL, LDLCALC, TRIG, CHOLHDL, LDLDIRECT in the last 72 hours. Thyroid Function Tests: No results for input(s): TSH, T4TOTAL, FREET4, T3FREE, THYROIDAB in the last 72 hours. Anemia Panel: Recent Labs    01/24/20 0413  FERRITIN 969*   Urine analysis:    Component Value Date/Time   COLORURINE AMBER (A) 01/24/2020 0410   APPEARANCEUR CLEAR 01/24/2020 0410   LABSPEC 1.029 01/24/2020 0410   PHURINE 5.0 01/24/2020 0410   GLUCOSEU NEGATIVE 01/24/2020 0410   HGBUR NEGATIVE 01/24/2020 0410   BILIRUBINUR NEGATIVE 01/24/2020 0410   KETONESUR 20 (A) 01/24/2020 0410   PROTEINUR 100 (A) 01/24/2020 0410   NITRITE NEGATIVE 01/24/2020 0410   LEUKOCYTESUR NEGATIVE 01/24/2020 0410   Sepsis Labs: Recent Results (from the past 240 hour(s))  SARS Coronavirus 2 by RT PCR (hospital order, performed in [place] hospital lab) Nasopharyngeal Nasopharyngeal Swab     Status: None   Collection Time: 01/24/20  4:15 AM   Specimen: Nasopharyngeal Swab  Result Value Ref Range Status   SARS Coronavirus 2 NEGATIVE NEGATIVE Final    Comment: (NOTE) SARS-CoV-2 target nucleic acids are NOT DETECTED. The SARS-CoV-2 RNA is generally detectable in upper and lower respiratory specimens during the acute phase of infection. The lowest concentration of SARS-CoV-2 viral copies this assay can detect is 250 copies / mL. A negative result does not preclude SARS-CoV-2 infection and should not be used as the sole basis for treatment or other patient management decisions.  A negative result may occur with improper specimen collection / handling, submission of specimen other than nasopharyngeal swab, presence of viral mutation(s) within the areas targeted by this assay, and inadequate number  of viral copies (<250 copies / mL). A negative result must be combined with clinical observations, patient history, and epidemiological information. Fact Sheet for Patients:   StrictlyIdeas.no Fact Sheet for Healthcare Providers: BankingDealers.co.za This test is not yet approved or cleared  by the [place] FDA and has been authorized for detection and/or diagnosis of SARS-CoV-2 by FDA under an Emergency Use Authorization (EUA).  This EUA will remain in effect (meaning this test can be used) for the duration of the COVID-19 declaration under Section 564(b)(1) of the Act, 21 U.S.C. section 360bbb-3(b)(1), unless the authorization is terminated or revoked sooner. Performed at [facility], [place] [address], [place], [place] [address]      Radiological Exams on Admission: DG Chest 2 View  Result Date: 01/24/2020 CLINICAL DATA:  Chest pain,  shortness of breath EXAM: CHEST - 2 VIEW COMPARISON:  Radiograph 01/22/2020, CT 01/21/2020 FINDINGS: Diffuse fine reticular opacities throughout the lungs without focal consolidation, pneumothorax or effusion. The cardiomediastinal contours are unremarkable. No acute osseous or soft tissue abnormality. IMPRESSION: Diffuse fine reticular opacities throughout the lungs could reflect compatible with sequela of known pneumonia though are more pronounced than on comparison exam from 01/22/2020. Cannot exclude superimposed edema or recrudescence of infection. Electronically Signed   By: Lovena Le M.D.   On: 01/24/2020 03:17   CT Angio Chest PE W and/or Wo Contrast  Result Date: 01/24/2020 CLINICAL DATA:  Chest pain, shortness of breath, on antibiotics for pneumonia EXAM: CT ANGIOGRAPHY CHEST WITH CONTRAST TECHNIQUE: Multidetector CT imaging of the chest was performed using the standard protocol during bolus administration of intravenous contrast. Multiplanar CT image reconstructions and MIPs were obtained  to evaluate the vascular anatomy. CONTRAST:  168mL OMNIPAQUE IOHEXOL 350 MG/ML SOLN COMPARISON:  Chest radiographs dated 01/24/2020. [place] CTA chest dated 01/21/2020. FINDINGS: Cardiovascular:  No evidence of pulmonary embolism. No evidence of thoracic aortic aneurysm or dissection. The heart is normal in size.  No pericardial effusion. Mediastinum/Nodes: No suspicious mediastinal lymphadenopathy. Visualized thyroid is unremarkable. Lungs/Pleura: Mild diffuse/multifocal ground-glass opacity in the lungs bilaterally, with subpleural sparing. Mild superimposed patchy opacity in the bilateral lower lobes. This appearance favors multifocal pneumonia, most commonly bacterial pneumonia, although atypical/viral etiologies are possible. Notably, this appearance is not characteristic for COVID. Overall appearance/distribution is similar to the prior, although possibly minimally progressive. No suspicious pulmonary nodules. No pleural effusion or pneumothorax. Upper Abdomen: Visualized upper abdomen is within normal limits. Musculoskeletal: Visualized osseous structures are within normal limits. Review of the MIP images confirms the above findings. IMPRESSION: No evidence of pulmonary embolism. Multifocal ground-glass opacities favor multifocal pneumonia with slight lower lobe predominance. This appearance is not characteristic for COVID. Possible slight progression from recent CTA chest. Electronically Signed   By: Julian Hy M.D.   On: 01/24/2020 06:20    EKG: Independently reviewed.  Sinus tachycardia 126 bpm with incomplete RBBB.  Assessment/Plan Sepsis secondary to pneumonia: Patient presented tachycardic and tachypneic with temperature reported Tobe elevated up to 102 F at home.  Lactic acid was reassuring at 1, but CRP 28.6 and procalcitonin 0.73 confirming presence of infection.  CT angiogram of the chest revealed multifocal groundglass opacities suggestive of a multi pneumonia with the lower  lobe predominance.  COVID-19 screening was negative.  He had been being treated for pneumonia with Augmentin and Levaquin without.  Patient had been started on empiric antibiotics of vancomycin, metronidazole, and cefepime.   -Admit to a medical telemetry bed -Follow-up blood cultures -Check sputum culture and urine studies -Continue empiric antibiotics of vancomycin and cefepime  -Azithromycin contraindicated with antiseizure medicine. -Tussionex as needed for cough -Mucinex -Consider checking a transthoracic echocardiogram if blood cultures become positive  Persistent asthma exacerbation: Acute.  Patient with a history of persistent asthma on Symbicort.  Noted to be acutely wheezing Found to be acutely wheezing with decreased overall aeration.  -Continuous pulse oximetry with nasal cannula oxygen to maintain O2 saturations in 90% -Peak flow monitoring -Levalbuterol nebs scheduled and as needed for shortness of breath/wheezing -Solu-Medrol 125 mg IV x1 dose, then 60 mg IV every 8 hours x 3, and then prednisone 40 mg daily to taper -Pharmacy substitution of budesonide and Brovana nebs for Symbicort inhaler  Hypokalemia: Acute.  Initial potassium 3.3.  Suspect secondary to vomiting and diarrhea. -Give Potassium 40  meq IV -Continue to monitor and replace as needed   Nausea, vomiting, and diarrhea: Acute.  Patient reported to have nausea, vomiting, and diarrhea. -Clear liquid diet as tolerated -Monitor intake and output -Antiemetics systolic -May warrant further imaging and/or testing if symptoms persist  Coagulopathy: Acute. On admission INR elevated at 1.6.  Could be secondary to carbamazepine. 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  Home medications include Carbatrol 600 mg every morning and 900 mg nightly. -Check carbamazepine level -Continue  carbatol  Normocytic Anemia: Acute.  Hemoglobin 12.1 on admission no reports of bleeding. -Continue to monitor  Marijuana use: Patient admits to marijuana use as well as history of vaping.  Urine drug screen did come back positive for opiates as well as marijuana.  No reported history of IV drug use. -Will need to continue to counsel on need of cessation of marijuana and vaping.  DVT prophylaxis: Lovenox Code Status: Full Family Communication: Patient's father updated at bedside Disposition Plan: Possible discharge home in 2 to 3 days. Consults called: None Admission status: Inpatient  Norval Morton MD [facility] Pager [phone]   If 7PM-7AM, please contact night-coverage [email or web address] Password [facility]  01/24/2020, 8:33 AM

## 2020-01-24 NOTE — ED Provider Notes (Signed)
[facility] EMERGENCY DEPARTMENT Provider Note   CSN: [id number] Arrival date & time: 01/24/20  0214     History Chief Complaint  Patient presents with  . Chest Pain    Steven Golden is a 24 y.o. male.  Patient with past medical history notable for seizures and asthma presents to the emergency department with a chief complaint of shortness of breath.  He reports associated chest pain.  He states that he has been seen several times at [facility] for pneumonia.  He was started on Augmentin, but then switched to Levaquin when he was not responding to therapy.  He denies any COVID-19 exposures.  He states that he has been tested several times and has been negative.  He reports running persistent fevers of 100 to 102 degrees almost daily for the past 7 days.  He denies any other associated symptoms.  The history is provided by the patient. No language interpreter was used.       Past Medical History:  Diagnosis Date  . Asthma   . Seizures ([place])     There are no problems to display for this patient.   History reviewed. No pertinent surgical history.     Family History  Problem Relation Age of Onset  . Asthma Mother   . High blood pressure Father   . Asthma Sister     Social History   Tobacco Use  . Smoking status: Never Smoker  . Smokeless tobacco: Current User    Types: Chew  Substance Use Topics  . Alcohol use: Not Currently  . Drug use: Yes    Types: Marijuana    Home Medications Prior to Admission medications   Medication Sig Start Date End Date Taking? Authorizing Provider  albuterol (VENTOLIN HFA) 108 (90 Base) MCG/ACT inhaler Can inhale two puffs every four to six hours as needed for cough or wheeze. 01/19/20   Kozlow, Donnamarie Poag, MD  CARBATROL 300 MG 12 hr capsule TAKE 2 CAPSULES BY MOUTH EVERY MORNING AND TAKE 3 CAPSULES EVERY EVENING 02/04/19   [provider]  ipratropium-albuterol (DUONEB) 0.5-2.5 (3) MG/3ML SOLN Can use one  vial in the nebulizer every 4-6 hours if needed for cough or wheeze. 11/19/19   Kozlow, Donnamarie Poag, MD  levocetirizine (XYZAL) 5 MG tablet Take 5 mg by mouth daily as needed.    [provider]  montelukast (SINGULAIR) 10 MG tablet Take one tablet once daily as directed. 11/19/19   Kozlow, Donnamarie Poag, MD  SYMBICORT 160-4.5 MCG/ACT inhaler Inhale two puffs twice daily to prevent cough or wheeze.  Rinse, gargle, and spit after use. 11/19/19   Kozlow, Donnamarie Poag, MD    Allergies    Patient has no known allergies.  Review of Systems   Review of Systems  All other systems reviewed and are negative.   Physical Exam Updated Vital Signs BP 122/71   Pulse 100   Temp 100 F (37.8 C) (Oral)   Resp (!) 24   Ht 6' (1.829 m)   Wt 80 kg   SpO2 96%   BMI 23.92 kg/m   Physical Exam Vitals and nursing note reviewed.  Constitutional:      Appearance: He is well-developed.  HENT:     Head: Normocephalic and atraumatic.  Eyes:     Conjunctiva/sclera: Conjunctivae normal.  Cardiovascular:     Rate and Rhythm: Normal rate and regular rhythm.     Heart sounds: No murmur.  Pulmonary:  Effort: Pulmonary effort is normal. No respiratory distress.     Breath sounds: Normal breath sounds.  Abdominal:     Palpations: Abdomen is soft.     Tenderness: There is no abdominal tenderness.  Musculoskeletal:        General: Normal range of motion.     Cervical back: Neck supple.  Skin:    General: Skin is warm and dry.  Neurological:     Mental Status: He is alert and oriented to person, place, and time.  Psychiatric:        Mood and Affect: Mood normal.        Behavior: Behavior normal.     ED Results / Procedures / Treatments   Labs (all labs ordered are listed, but only abnormal results are displayed) Labs Reviewed  BASIC METABOLIC PANEL - Abnormal; Notable for the following components:      Result Value   Potassium 3.3 (*)    Glucose, Bld 116 (*)    Calcium 8.8 (*)    All other  components within normal limits  CBC - Abnormal; Notable for the following components:   RBC 3.74 (*)    Hemoglobin 12.1 (*)    HCT 36.2 (*)    RDW 11.0 (*)    All other components within normal limits  PROTIME-INR - Abnormal; Notable for the following components:   Prothrombin Time 18.6 (*)    INR 1.6 (*)    All other components within normal limits  APTT - Abnormal; Notable for the following components:   aPTT 38 (*)    All other components within normal limits  URINALYSIS, ROUTINE W REFLEX MICROSCOPIC - Abnormal; Notable for the following components:   Color, Urine AMBER (*)    Ketones, ur 20 (*)    Protein, ur 100 (*)    All other components within normal limits  FERRITIN - Abnormal; Notable for the following components:   Ferritin 969 (*)    All other components within normal limits  FIBRINOGEN - Abnormal; Notable for the following components:   Fibrinogen >800 (*)    All other components within normal limits  LACTATE DEHYDROGENASE - Abnormal; Notable for the following components:   LDH 222 (*)    All other components within normal limits  C-REACTIVE PROTEIN - Abnormal; Notable for the following components:   CRP 28.6 (*)    All other components within normal limits  RAPID URINE DRUG SCREEN, HOSP PERFORMED - Abnormal; Notable for the following components:   Opiates POSITIVE (*)    Tetrahydrocannabinol POSITIVE (*)    All other components within normal limits  SARS CORONAVIRUS 2 BY RT PCR (HOSPITAL ORDER, [facility])  CULTURE, BLOOD (ROUTINE X 2)  CULTURE, BLOOD (ROUTINE X 2)  URINE CULTURE  LACTIC ACID, PLASMA  RAPID HIV SCREEN (HIV 1/2 AB+AG)  LACTIC ACID, PLASMA  HIV ANTIBODY (ROUTINE TESTING W REFLEX)  INFLUENZA PANEL BY PCR (TYPE A & B)  D-DIMER, QUANTITATIVE (NOT AT [facility])  PROCALCITONIN  POC SARS CORONAVIRUS 2 AG -  ED  ABO/RH  TROPONIN I (HIGH SENSITIVITY)    EKG EKG Interpretation  Date/Time:  Saturday Jan 24 2020 02:14:13  EDT Ventricular Rate:  126 PR Interval:  118 QRS Duration: 98 QT Interval:  306 QTC Calculation: 443 R Axis:   99 Text Interpretation: Sinus tachycardia Rightward axis Incomplete right bundle branch block Confirmed by Randal Buba, April ([id number]) on 01/24/2020 3:02:29 AM   Radiology DG Chest 2 View  Result Date: 01/24/2020 CLINICAL DATA:  Chest pain, shortness of breath EXAM: CHEST - 2 VIEW COMPARISON:  Radiograph 01/22/2020, CT 01/21/2020 FINDINGS: Diffuse fine reticular opacities throughout the lungs without focal consolidation, pneumothorax or effusion. The cardiomediastinal contours are unremarkable. No acute osseous or soft tissue abnormality. IMPRESSION: Diffuse fine reticular opacities throughout the lungs could reflect compatible with sequela of known pneumonia though are more pronounced than on comparison exam from 01/22/2020. Cannot exclude superimposed edema or recrudescence of infection. Electronically Signed   By: Lovena Le M.D.   On: 01/24/2020 03:17    Procedures .Critical Care Performed by: Montine Circle, PA-C Authorized by: Montine Circle, PA-C   Critical care provider statement:    Critical care time (minutes):  45   Critical care was necessary to treat or prevent imminent or life-threatening deterioration of the following conditions:  Sepsis   Critical care was time spent personally by me on the following activities:  Discussions with consultants, evaluation of patient's response to treatment, examination of patient, ordering and performing treatments and interventions, ordering and review of laboratory studies, ordering and review of radiographic studies, pulse oximetry, re-evaluation of patient's condition, obtaining history from patient or surrogate and review of old charts   (including critical care time)  Medications Ordered in ED Medications  sodium chloride flush (NS) 0.9 % injection 3 mL (3 mLs Intravenous Not Given 01/24/20 0313)  ceFEPIme (MAXIPIME) 2 g in  sodium chloride 0.9 % 100 mL IVPB (has no administration in time range)  metroNIDAZOLE (FLAGYL) IVPB 500 mg (has no administration in time range)  ondansetron (ZOFRAN) injection 4 mg (has no administration in time range)  vancomycin (VANCOREADY) IVPB 1500 mg/300 mL (has no administration in time range)    ED Course  I have reviewed the triage vital signs and the nursing notes.  Pertinent labs & imaging results that were available during my care of the patient were reviewed by me and considered in my medical decision making (see chart for details).    MDM Rules/Calculators/A&P                      This patient complains of SOB and CP, this involves an extensive number of treatment options, and is a complaint that carries with it a high risk of complications and morbidity.  The differential diagnosis includes pneumonia, covid, PE, endocarditis.  Pertinent Labs I ordered, reviewed, and interpreted labs, which included CBC which shows no leukocytosis.  HGB is 12.1.  BMP notable for K of 3.3.  Covid negative. Inflammatory markers are elevated, Ferritin 969, fibrinogen >800, LDH 222, CRP 28.6.  Imaging Interpretation I ordered imaging studies which included CXR, which showed worsening opacities when compared to CXR on 5/20.  Medications I ordered medication broad spectrum abx for sepsis.  Sources Additional history obtained from friend/family at bedside. Previous records obtained and reviewed shows prior hx of asthma.  Consultants Dr. Hal Hope - TR[facility], who is appreciated for admitting the patient.  States patient will be evaluated by the morning team due to the hour.  Critical Interventions  Broad spectrum abx.  Reassessments After the interventions stated above, I reevaluated the patient and found without significant change in condition.  Final Clinical Impression(s) / ED Diagnoses Final diagnoses:  Sepsis due to Streptococcus species, unspecified whether acute organ dysfunction  present [facility])    Rx / [place] Orders ED Discharge Orders    None       Montine Circle, PA-C 01/24/20 [id number]  Palumbo, April, MD 01/25/20 [id number]

## 2020-01-24 NOTE — Progress Notes (Signed)
   01/24/20 1637  Assess: MEWS Score  Temp 98.5 F (36.9 C)  BP 131/71  Pulse Rate (!) 102  Resp (!) 25  SpO2 99 %  O2 Device Nasal Cannula  O2 Flow Rate (L/min) 6 L/min  Assess: MEWS Score  MEWS Temp 0  MEWS Systolic 0  MEWS Pulse 1  MEWS RR 1  MEWS LOC 0  MEWS Score 2  MEWS Score Color Yellow  Assess: if the MEWS score is Yellow or Red  Were vital signs taken at a resting state? Yes  Focused Assessment Documented focused assessment  Early Detection of Sepsis Score *See Row Information* Low  MEWS guidelines implemented *See Row Information* No, vital signs rechecked (previously yellow prior transport to floor)  Treat  MEWS Interventions Administered scheduled meds/treatments  Take Vital Signs  Increase Vital Sign Frequency  Yellow: Q 2hr X 2 then Q 4hr X 2, if remains yellow, continue Q 4hrs  Notify: Charge Nurse/RN  Name of Charge Nurse/RN Notified Derald Macleod, RN  Date Charge Nurse/RN Notified 01/24/20  Time Charge Nurse/RN Notified 1651   Breathing treatments given prior to transfer to 6N Rm 10, Will continue to monitor patient

## 2020-01-24 NOTE — ED Notes (Signed)
Pt. Stated, nausea after the breathing

## 2020-01-24 NOTE — ED Notes (Signed)
Given Ginger ASle to sip

## 2020-01-24 NOTE — ED Notes (Signed)
Pt. Stated, I feel a little better after the breathing treatment, my stomach is churning.

## 2020-01-24 NOTE — Progress Notes (Signed)
Patient arrived to 6N10 via stretcher from ED, ambulatory, with O2 via [place] at 6 lmp, VS on mews, started [place] protocol, denies any pain at this time, oriented to room, use of call bell, bed controls and incentive spirometer. Informed patient about visitation policy. Will continue to monitor.

## 2020-01-24 NOTE — ED Notes (Signed)
Lunch Tray Ordered @ 1033. 

## 2020-01-25 LAB — URINE CULTURE: Culture: NO GROWTH

## 2020-01-25 LAB — EXPECTORATED SPUTUM ASSESSMENT W GRAM STAIN, RFLX TO RESP C

## 2020-01-25 MED ORDER — VANCOMYCIN HCL IN DEXTROSE 1-5 GM/200ML-% IV SOLN
1000.0000 mg | Freq: Three times a day (TID) | INTRAVENOUS | Status: DC
  Administered 2020-01-25 – 2020-01-27 (×6): 1000 mg via INTRAVENOUS
  Filled 2020-01-25 (×7): qty 200

## 2020-01-25 NOTE — Progress Notes (Signed)
Steven Golden  [id number] DOB: 1996/06/13 DOA: 01/24/2020 PCP: Raina Mina., MD    Brief Narrative:  24 year old with a history of asthma, Sturge-Weber, seizure disorder, and Covid pneumonia who presented with progressively worsening cough and shortness of breath over 1 week.  He registered a fever to 102 F at home.  He had been seen in urgent care and was placed on Augmentin and in a subsequent visit this was changed to Levaquin with steroids.  He reports very poor appetite with nausea and vomiting often times set off by coughing spells.  In the ED a Covid screen was negative.  CTa of the chest noted multifocal pneumonia with lower lobe predominance.  Patient was started on empiric antibiotic and admitted to the hospital.  Significant Events: 5/22 admit via [place]  Antimicrobials:  Vancomycin 5/22 > Zosyn 5/22 > Flagyl 5/22 >  Subjective: Patient feels that he is significantly improved since his admission.  He still reports some dyspnea on exertion and orthostatic symptoms.  He denies chest pain nausea vomiting or abdominal pain.  Detailed history leads me to suggest his current pulmonary status is due to multiple factors to include probable pulmonary involvement with his Covid disease 4-5 months ago, likely aspiration related to recent seizure activity, and then superinfection leading to bacterial pneumonia.  Assessment & Plan:  Sepsis due to pneumonia Continue broad-spectrum empiric antibiotic coverage and volume resuscitation -follow-up chest x-ray in a.m. -clinically improving already   Acute asthma exacerbation Continue systemic steroids -wheezing much improved at time of exam  Hypokalemia Due to poor intake and acute illness -supplement and follow  Nausea and vomiting with diarrhea Much improved/essentially resolved -follow trend  Coagulopathy INR elevated at 1.6 at time of admission - ?due to carbamazepine -recheck in a.m.  Sturge-Weber and seizure  disorder Continue home medications  Normocytic anemia No evidence of acute blood loss   DVT prophylaxis: Lovenox Code Status: FULL CODE Family Communication:  Status is: Inpatient  Remains inpatient appropriate because:IV treatments appropriate due to intensity of illness or inability to take PO   Dispo: The patient is from: Home              Anticipated d/c is to: Home              Anticipated d/c date is: 2 days              Patient currently is not medically stable to d/c.   Consultants:  none  Objective: Blood pressure 121/77, pulse 90, temperature 97.8 F (36.6 C), resp. rate 18, height 6\' 1"  (1.854 m), weight 80 kg, SpO2 100 %.  Intake/Output Summary (Last 24 hours) at 01/25/2020 0914 Last data filed at 01/25/2020 0539 Gross per 24 hour  Intake 4078.34 ml  Output 1475 ml  Net 2603.34 ml   Filed Weights   01/24/20 0219  Weight: 80 kg    Examination: General: No acute respiratory distress Lungs: Fine bibasilar crackles with no active wheezing Cardiovascular: Regular rate and rhythm without murmur gallop or rub normal S1 and S2 Abdomen: Nontender, nondistended, soft, bowel sounds positive, no rebound, no ascites, no appreciable mass Extremities: No significant cyanosis, clubbing, or edema bilateral lower extremities  CBC: Recent Labs  Lab 01/24/20 0416  WBC 9.7  HGB 12.1*  HCT 36.2*  MCV 96.8  PLT [id number]   Basic Metabolic Panel: Recent Labs  Lab 01/24/20 0416  NA 138  K 3.3*  CL 101  CO2 25  GLUCOSE 116*  BUN 11  CREATININE 0.97  CALCIUM 8.8*   GFR: Estimated Creatinine Clearance: 133.9 mL/min (by C-G formula based on SCr of 0.97 mg/dL).  Liver Function Tests: Recent Labs  Lab 01/24/20 0940  AST 25  ALT 38  ALKPHOS 84  BILITOT 1.7*  PROT 6.8  ALBUMIN 2.5*    Coagulation Profile: Recent Labs  Lab 01/24/20 0416  INR 1.6*     Recent Results (from the past 240 hour(s))  Urine culture     Status: None   Collection Time: 01/24/20   4:10 AM   Specimen: In/Out Cath Urine  Result Value Ref Range Status   Specimen Description IN/OUT CATH URINE  Final   Special Requests NONE  Final   Culture   Final    NO GROWTH Performed at [facility], [place] [address] [place], [place] [address]    Report Status 01/25/2020 FINAL  Final  SARS Coronavirus 2 by RT PCR (hospital order, performed in [facility] hospital lab) Nasopharyngeal Nasopharyngeal Swab     Status: None   Collection Time: 01/24/20  4:15 AM   Specimen: Nasopharyngeal Swab  Result Value Ref Range Status   SARS Coronavirus 2 NEGATIVE NEGATIVE Final    Comment: (NOTE) SARS-CoV-2 target nucleic acids are NOT DETECTED. The SARS-CoV-2 RNA is generally detectable in upper and lower respiratory specimens during the acute phase of infection. The lowest concentration of SARS-CoV-2 viral copies this assay can detect is 250 copies / mL. A negative result does not preclude SARS-CoV-2 infection and should not be used as the sole basis for treatment or other patient management decisions.  A negative result may occur with improper specimen collection / handling, submission of specimen other than nasopharyngeal swab, presence of viral mutation(s) within the areas targeted by this assay, and inadequate number of viral copies (<250 copies / mL). A negative result must be combined with clinical observations, patient history, and epidemiological information. Fact Sheet for Patients:   StrictlyIdeas.no Fact Sheet for Healthcare Providers: BankingDealers.co.za This test is not yet approved or cleared  by the [place] FDA and has been authorized for detection and/or diagnosis of SARS-CoV-2 by FDA under an Emergency Use Authorization (EUA).  This EUA will remain in effect (meaning this test can be used) for the duration of the COVID-19 declaration under Section 564(b)(1) of the Act, 21 U.S.C. section 360bbb-3(b)(1), unless the  authorization is terminated or revoked sooner. Performed at [facility], [place] [address], [place], [place] [address]      Scheduled Meds: . arformoterol  15 mcg Nebulization BID  . budesonide (PULMICORT) nebulizer solution  0.5 mg Nebulization BID  . carbamazepine  600 mg Oral Daily   And  . carbamazepine  900 mg Oral QHS  . enoxaparin (LOVENOX) injection  40 mg Subcutaneous Q24H  . guaiFENesin  600 mg Oral BID  . levalbuterol  1.25 mg Inhalation Q6H  . methylPREDNISolone (SOLU-MEDROL) injection  60 mg Intravenous Q8H  . [START ON 01/26/2020] predniSONE  40 mg Oral Q breakfast  . sodium chloride flush  3 mL Intravenous Once   Continuous Infusions: . sodium chloride 100 mL/hr at 01/25/20 0709  . ceFEPime (MAXIPIME) IV Stopped (01/25/20 0533)  . vancomycin 1,500 mg (01/25/20 0500)     LOS: 1 day   Cherene Altes, MD [facility] Office  [phone] Pager - Text Page per Amion  If 7PM-7AM, please contact night-coverage per Amion 01/25/2020, 9:14 AM

## 2020-01-25 NOTE — Progress Notes (Signed)
Pt complained of tightness in the chest. RR 22, no wheezing upon auscaltation. HR was 104, O2 was 96 on 3L oxygen. Breathing treatment PRN given, and increased oxygen from 3L to 4L for comfort. Notified RRT. Will monitor pt.

## 2020-01-25 NOTE — Progress Notes (Signed)
RT went in to instruct patient with flutter. Appears to be doing much better than yesterday, stated he is feeling some better. Tolerated flutter well. Left at bedside and instructed to use a few times a day. RT to monitor.

## 2020-01-25 NOTE — Progress Notes (Signed)
Pharmacy Antibiotic Note  Steven Golden is a 24 y.o. male with history of COVID-19 infection admitted on 01/24/2020 with chest pain/SOB, possible pneumonia. Pharmacy was consulted for Vancomycin and Cefepime dosing.   CT showing ground glass opacities in bilateral lungs, patchy opacity in bilat lower lobes. Will increase dose of vancomycin for probable multifocal pneumonia to target trough 15-20.  Plan: Increase Vancomycin to 1000 mg IV q8h Cefepime 2 g IV q8h Monitor clinical picture, renal function, vanc levels at Css if indicated F/U C&S, abx de-escalation, LOT   Height: 6\' 1"  (185.4 cm) Weight: 80 kg (176 lb 5.9 oz) IBW/kg (Calculated) : 79.9  Temp (24hrs), Avg:98.3 F (36.8 C), Min:97.8 F (36.6 C), Max:98.9 F (37.2 C)  Recent Labs  Lab 01/24/20 0413 01/24/20 0416 01/24/20 0940  WBC  --  9.7  --   CREATININE  --  0.97  --   LATICACIDVEN 1.0  --  1.0    Estimated Creatinine Clearance: 133.9 mL/min (by C-G formula based on SCr of 0.97 mg/dL).    Allergies  Allergen Reactions  . Azithromycin Other (See Comments)    Seizure medication interaction    Brendolyn Patty, PharmD PGY2 Pharmacy Resident Phone [phone]  01/25/2020   9:26 AM

## 2020-01-25 NOTE — Progress Notes (Signed)
Flutter valve left at bedside. Will begin in am.

## 2020-01-26 ENCOUNTER — Inpatient Hospital Stay (HOSPITAL_COMMUNITY): Payer: Commercial Managed Care - PPO

## 2020-01-26 LAB — CBC
HCT: 34.6 % — ABNORMAL LOW (ref 39.0–52.0)
Hemoglobin: 11.7 g/dL — ABNORMAL LOW (ref 13.0–17.0)
MCH: 32.1 pg (ref 26.0–34.0)
MCHC: 33.8 g/dL (ref 30.0–36.0)
MCV: 94.8 fL (ref 80.0–100.0)
Platelets: 451 10*3/uL — ABNORMAL HIGH (ref 150–400)
RBC: 3.65 MIL/uL — ABNORMAL LOW (ref 4.22–5.81)
RDW: 10.6 % — ABNORMAL LOW (ref 11.5–15.5)
WBC: 10.5 10*3/uL (ref 4.0–10.5)
nRBC: 0 % (ref 0.0–0.2)

## 2020-01-26 LAB — MAGNESIUM: Magnesium: 2 mg/dL (ref 1.7–2.4)

## 2020-01-26 LAB — BASIC METABOLIC PANEL
Anion gap: 11 (ref 5–15)
BUN: 13 mg/dL (ref 6–20)
CO2: 23 mmol/L (ref 22–32)
Calcium: 8.7 mg/dL — ABNORMAL LOW (ref 8.9–10.3)
Chloride: 104 mmol/L (ref 98–111)
Creatinine, Ser: 0.68 mg/dL (ref 0.61–1.24)
GFR calc Af Amer: >60 mL/min (ref >60)
GFR calc non Af Amer: >60 mL/min (ref >60)
Glucose, Bld: 150 mg/dL — ABNORMAL HIGH (ref 70–99)
Potassium: 3.7 mmol/L (ref 3.5–5.1)
Sodium: 138 mmol/L (ref 135–145)

## 2020-01-26 LAB — LEGIONELLA PNEUMOPHILA SEROGP 1 UR AG: L. pneumophila Serogp 1 Ur Ag: NEGATIVE

## 2020-01-26 LAB — MRSA PCR SCREENING: MRSA by PCR: NEGATIVE

## 2020-01-26 LAB — PROTIME-INR
INR: 1.3 — ABNORMAL HIGH (ref 0.8–1.2)
Prothrombin Time: 15.8 seconds — ABNORMAL HIGH (ref 11.4–15.2)

## 2020-01-26 MED ORDER — ACETAMINOPHEN 325 MG PO TABS
650.0000 mg | ORAL_TABLET | Freq: Four times a day (QID) | ORAL | Status: DC | PRN
  Administered 2020-01-26 – 2020-01-28 (×8): 650 mg via ORAL
  Filled 2020-01-26 (×10): qty 2

## 2020-01-26 MED ORDER — HYDROXYZINE HCL 10 MG PO TABS
10.0000 mg | ORAL_TABLET | Freq: Three times a day (TID) | ORAL | Status: DC | PRN
  Administered 2020-01-26 – 2020-01-29 (×5): 10 mg via ORAL
  Filled 2020-01-26 (×8): qty 1

## 2020-01-26 MED ORDER — ZOLPIDEM TARTRATE 5 MG PO TABS
10.0000 mg | ORAL_TABLET | Freq: Every evening | ORAL | Status: DC | PRN
  Administered 2020-01-26: 10 mg via ORAL
  Filled 2020-01-26: qty 2

## 2020-01-26 MED ORDER — METHOCARBAMOL 500 MG PO TABS
500.0000 mg | ORAL_TABLET | Freq: Four times a day (QID) | ORAL | Status: DC | PRN
  Administered 2020-01-26 – 2020-01-27 (×4): 500 mg via ORAL
  Filled 2020-01-26 (×4): qty 1

## 2020-01-26 NOTE — Progress Notes (Signed)
Steven Golden  [id number] DOB: [birth year] DOA: 01/24/2020 PCP: Raina Mina., MD    Brief Narrative:  24 year old with a history of asthma, Sturge-Weber, seizure disorder, and Covid pneumonia who presented with progressively worsening cough and shortness of breath over 1 week.  He registered a fever to 102 F at home.  He had been seen in urgent care and was placed on Augmentin and in a subsequent visit this was changed to Levaquin with steroids.  He reports very poor appetite with nausea and vomiting often times set off by coughing spells.  In the ED a Covid screen was negative.  CTa of the chest noted multifocal pneumonia with lower lobe predominance.  Patient was started on empiric antibiotic and admitted to the hospital.  Significant Events: 5/22 admit via [place]  Antimicrobials:  Vancomycin 5/22 > Zosyn 5/22 > Flagyl 5/22 >  Subjective: Complains of severe anxiety for which he chronically smokes marijuana.  CXR today shows worsening of diffuse pulmonary infiltrates, coincident with ongoing aggressive volume resuscitation.  Patient actually feels somewhat more short of breath today.  He denies chest pain.  He does complain of muscle spasms in his neck and his low back which he frequently experiences at home but are worse because of being in the hospital bed.  Assessment & Plan:  Sepsis due to pneumonia Continue broad-spectrum empiric antibiotic coverage -slow volume resuscitation -follow-up repeat chest x-ray in a.m.  Acute asthma exacerbation Continue systemic steroids -essentially resolved at this time  Hypokalemia Due to poor intake and acute illness -corrected with supplementation  Nausea and vomiting with diarrhea Resolved at this time  Coagulopathy INR elevated at 1.6 at time of admission - ?due to carbamazepine versus coagulopathy related to sepsis -improving  Sturge-Weber and seizure disorder Continue home medications  Normocytic anemia No evidence of acute  blood loss   DVT prophylaxis: Lovenox Code Status: FULL CODE Family Communication:  Status is: Inpatient  Remains inpatient appropriate because:IV treatments appropriate due to intensity of illness or inability to take PO   Dispo: The patient is from: Home              Anticipated d/c is to: Home              Anticipated d/c date is: 2 days              Patient currently is not medically stable to d/c.   Consultants:  none  Objective: Blood pressure 139/81, pulse 92, temperature 98.2 F (36.8 C), temperature source Oral, resp. rate 18, height 6\' 1"  (1.854 m), weight 80 kg, SpO2 97 %.  Intake/Output Summary (Last 24 hours) at 01/26/2020 0936 Last data filed at 01/26/2020 0616 Gross per 24 hour  Intake 1149.36 ml  Output 650 ml  Net 499.36 ml   Filed Weights   01/24/20 0219  Weight: 80 kg    Examination: General: No acute respiratory distress -appears anxious Lungs: Diffuse crackles bilateral air fields with no wheezing Cardiovascular: RRR without murmur or rub Abdomen: Nontender, nondistended, soft, bowel sounds positive, no rebound, no ascites, no appreciable mass Extremities: No significant edema bilateral lower extremities  CBC: Recent Labs  Lab 01/24/20 0416 01/26/20 0300  WBC 9.7 10.5  HGB 12.1* 11.7*  HCT 36.2* 34.6*  MCV 96.8 94.8  PLT 390 [id number]*   Basic Metabolic Panel: Recent Labs  Lab 01/24/20 0416 01/26/20 0300  NA 138 138  K 3.3* 3.7  CL 101 104  CO2 25 23  GLUCOSE  116* 150*  BUN 11 13  CREATININE 0.97 0.68  CALCIUM 8.8* 8.7*  MG  --  2.0   GFR: Estimated Creatinine Clearance: 162.3 mL/min (by C-G formula based on SCr of 0.68 mg/dL).  Liver Function Tests: Recent Labs  Lab 01/24/20 0940  AST 25  ALT 38  ALKPHOS 84  BILITOT 1.7*  PROT 6.8  ALBUMIN 2.5*    Coagulation Profile: Recent Labs  Lab 01/24/20 0416 01/26/20 0300  INR 1.6* 1.3*     Recent Results (from the past 240 hour(s))  Urine culture     Status: None    Collection Time: 01/24/20  4:10 AM   Specimen: In/Out Cath Urine  Result Value Ref Range Status   Specimen Description IN/OUT CATH URINE  Final   Special Requests NONE  Final   Culture   Final    NO GROWTH Performed at [facility], [place] [address], [place], [place] [address]    Report Status 01/25/2020 FINAL  Final  Blood Culture (routine x 2)     Status: None (Preliminary result)   Collection Time: 01/24/20  4:13 AM   Specimen: BLOOD  Result Value Ref Range Status   Specimen Description BLOOD RIGHT ARM  Final   Special Requests   Final    BOTTLES DRAWN AEROBIC AND ANAEROBIC Blood Culture adequate volume   Culture   Final    NO GROWTH 2 DAYS Performed at [facility], [place] [address], [place], [place] [address]    Report Status PENDING  Incomplete  Blood Culture (routine x 2)     Status: None (Preliminary result)   Collection Time: 01/24/20  4:14 AM   Specimen: BLOOD  Result Value Ref Range Status   Specimen Description BLOOD LEFT ARM  Final   Special Requests   Final    BOTTLES DRAWN AEROBIC ONLY Blood Culture results may not be optimal due to an excessive volume of blood received in culture bottles   Culture   Final    NO GROWTH 2 DAYS Performed at [facility], [place] [address], [place], [place] [address]    Report Status PENDING  Incomplete  SARS Coronavirus 2 by RT PCR (hospital order, performed in [facility] hospital lab) Nasopharyngeal Nasopharyngeal Swab     Status: None   Collection Time: 01/24/20  4:15 AM   Specimen: Nasopharyngeal Swab  Result Value Ref Range Status   SARS Coronavirus 2 NEGATIVE NEGATIVE Final    Comment: (NOTE) SARS-CoV-2 target nucleic acids are NOT DETECTED. The SARS-CoV-2 RNA is generally detectable in upper and lower respiratory specimens during the acute phase of infection. The lowest concentration of SARS-CoV-2 viral copies this assay can detect is 250 copies / mL. A negative result does not preclude SARS-CoV-2  infection and should not be used as the sole basis for treatment or other patient management decisions.  A negative result may occur with improper specimen collection / handling, submission of specimen other than nasopharyngeal swab, presence of viral mutation(s) within the areas targeted by this assay, and inadequate number of viral copies (<250 copies / mL). A negative result must be combined with clinical observations, patient history, and epidemiological information. Fact Sheet for Patients:   StrictlyIdeas.no Fact Sheet for Healthcare Providers: BankingDealers.co.za This test is not yet approved or cleared  by the [place] FDA and has been authorized for detection and/or diagnosis of SARS-CoV-2 by FDA under an Emergency Use Authorization (EUA).  This EUA will remain in  effect (meaning this test can be used) for the duration of the COVID-19 declaration under Section 564(b)(1) of the Act, 21 U.S.C. section 360bbb-3(b)(1), unless the authorization is terminated or revoked sooner. Performed at [facility], [place] [address] [place], [place] [address]   Culture, sputum-assessment     Status: None   Collection Time: 01/25/20  5:45 AM   Specimen: Expectorated Sputum  Result Value Ref Range Status   Specimen Description EXPECTORATED SPUTUM  Final   Special Requests NONE  Final   Sputum evaluation   Final    Sputum specimen not acceptable for testing.  Please recollect.   Gram Stain Report Called to,Read Back By and Verified With: D. ANGELITO RN, AT [id number] 01/25/20 BY Rush Landmark Performed at [facility], [place] [address] [place], [place] [address]    Report Status 01/25/2020 FINAL  Final     Scheduled Meds: . arformoterol  15 mcg Nebulization BID  . budesonide (PULMICORT) nebulizer solution  0.5 mg Nebulization BID  . carbamazepine  600 mg Oral Daily   And  . carbamazepine  900 mg Oral QHS  . enoxaparin (LOVENOX)  injection  40 mg Subcutaneous Q24H  . guaiFENesin  600 mg Oral BID  . levalbuterol  1.25 mg Inhalation Q6H  . predniSONE  40 mg Oral Q breakfast  . sodium chloride flush  3 mL Intravenous Once   Continuous Infusions: . sodium chloride 125 mL/hr at 01/25/20 1623  . ceFEPime (MAXIPIME) IV 2 g (01/26/20 0530)  . vancomycin 1,000 mg (01/26/20 0529)     LOS: 2 days   Cherene Altes, MD [facility] Office  [phone] Pager - Text Page per Amion  If 7PM-7AM, please contact night-coverage per Amion 01/26/2020, 9:36 AM

## 2020-01-27 ENCOUNTER — Inpatient Hospital Stay (HOSPITAL_COMMUNITY): Payer: Commercial Managed Care - PPO

## 2020-01-27 LAB — COMPREHENSIVE METABOLIC PANEL
ALT: 44 U/L (ref 0–44)
AST: 42 U/L — ABNORMAL HIGH (ref 15–41)
Albumin: 2.3 g/dL — ABNORMAL LOW (ref 3.5–5.0)
Alkaline Phosphatase: 82 U/L (ref 38–126)
Anion gap: 10 (ref 5–15)
BUN: 8 mg/dL (ref 6–20)
CO2: 26 mmol/L (ref 22–32)
Calcium: 8.3 mg/dL — ABNORMAL LOW (ref 8.9–10.3)
Chloride: 99 mmol/L (ref 98–111)
Creatinine, Ser: 0.72 mg/dL (ref 0.61–1.24)
GFR calc Af Amer: >60 mL/min (ref >60)
GFR calc non Af Amer: >60 mL/min (ref >60)
Glucose, Bld: 108 mg/dL — ABNORMAL HIGH (ref 70–99)
Potassium: 3.3 mmol/L — ABNORMAL LOW (ref 3.5–5.1)
Sodium: 135 mmol/L (ref 135–145)
Total Bilirubin: 1.5 mg/dL — ABNORMAL HIGH (ref 0.3–1.2)
Total Protein: 6.3 g/dL — ABNORMAL LOW (ref 6.5–8.1)

## 2020-01-27 LAB — CARBAMAZEPINE LEVEL, TOTAL: Carbamazepine Lvl: 13 ug/mL — ABNORMAL HIGH (ref 4.0–12.0)

## 2020-01-27 LAB — CBC
HCT: 35.2 % — ABNORMAL LOW (ref 39.0–52.0)
Hemoglobin: 11.8 g/dL — ABNORMAL LOW (ref 13.0–17.0)
MCH: 31.8 pg (ref 26.0–34.0)
MCHC: 33.5 g/dL (ref 30.0–36.0)
MCV: 94.9 fL (ref 80.0–100.0)
Platelets: 437 10*3/uL — ABNORMAL HIGH (ref 150–400)
RBC: 3.71 MIL/uL — ABNORMAL LOW (ref 4.22–5.81)
RDW: 10.9 % — ABNORMAL LOW (ref 11.5–15.5)
WBC: 8.1 10*3/uL (ref 4.0–10.5)
nRBC: 0 % (ref 0.0–0.2)

## 2020-01-27 LAB — PROTIME-INR
INR: 1.4 — ABNORMAL HIGH (ref 0.8–1.2)
Prothrombin Time: 16.4 seconds — ABNORMAL HIGH (ref 11.4–15.2)

## 2020-01-27 MED ORDER — POTASSIUM CHLORIDE CRYS ER 20 MEQ PO TBCR
40.0000 meq | EXTENDED_RELEASE_TABLET | Freq: Two times a day (BID) | ORAL | Status: AC
  Administered 2020-01-27 – 2020-01-28 (×3): 40 meq via ORAL
  Filled 2020-01-27 (×3): qty 2

## 2020-01-27 NOTE — Progress Notes (Signed)
MRSA PCR came back neg. Ok to [employer] per Dr Thereasa Solo.   Onnie Boer, PharmD, BCIDP, AAHIVP, CPP Infectious Disease Pharmacist 01/27/2020 11:02 AM

## 2020-01-27 NOTE — TOC Initial Note (Signed)
Transition of Care [facility]) - Initial/Assessment Note    Patient Details  Name: Steven Golden MRN: [id number] Date of Birth: [birth year]  Transition of Care [facility]) CM/SW Contact:    Marilu Favre, RN Phone Number: 01/27/2020, 1:57 PM  Clinical Narrative:                 Confirmed face sheet information with patient and his father at bedside. Patient active with DR Bea Graff.   Patient does not have oxygen at home. Will continue to follow for discharge needs.  Expected Discharge Plan: Home/Self Care     Patient Goals and CMS Choice Patient states their goals for this hospitalization and ongoing recovery are:: to return to home CMS Medicare.gov Compare Post Acute Care list provided to:: Patient Choice offered to / list presented to : NA  Expected Discharge Plan and Services Expected Discharge Plan: Home/Self Care       Living arrangements for the past 2 months: Single Family Home                   DME Agency: NA       HH Arranged: NA          Prior Living Arrangements/Services Living arrangements for the past 2 months: Single Family Home Lives with:: Parents Patient language and need for interpreter reviewed:: Yes Do you feel safe going back to the place where you live?: Yes      Need for Family Participation in Patient Care: Yes (Comment) Care giver support system in place?: Yes (comment)   Criminal Activity/Legal Involvement Pertinent to Current Situation/Hospitalization: No - Comment as needed  Activities of Daily Living      Permission Sought/Granted   Permission granted to share information with : Yes, Verbal Permission Granted  Share Information with NAME: Jasiri Puyear father           Emotional Assessment Appearance:: Appears stated age Attitude/Demeanor/Rapport: Engaged Affect (typically observed): Accepting Orientation: : Oriented to Self, Oriented to Place, Oriented to  Time, Oriented to Situation Alcohol / Substance Use: Not Applicable Psych  Involvement: No (comment)  Admission diagnosis:  Sepsis ([place]) [A41.9] Sepsis due to Streptococcus species, unspecified whether acute organ dysfunction present [facility]) [A40.9] Patient Active Problem List   Diagnosis Date Noted  . Sepsis due to pneumonia ([place]) 01/24/2020  . Persistent asthma with acute exacerbation 01/24/2020  . Nausea, vomiting, and diarrhea 01/24/2020  . Hypokalemia 01/24/2020  . 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 Seizure disorder ([place]) 01/24/2020  . Normocytic anemia 01/24/2020  . Marijuana use 01/24/2020   PCP:  Raina Mina., MD Pharmacy:   [facility], [place] [address] Phone: [phone] Fax: [phone]     Social Determinants of Health (SDOH) Interventions    Readmission Risk Interventions Readmission Risk Prevention Plan 01/27/2020  Transportation Screening Complete  PCP or Specialist Appt within 5-7 Days Complete  Home Care Screening Complete  Medication Review (RN CM) Referral to Pharmacy  Some recent data might be hidden

## 2020-01-27 NOTE — Progress Notes (Signed)
Weaver Secrist  [id number] DOB: 05/28/1996 DOA: 01/24/2020 PCP: Raina Mina., MD    Brief Narrative:  24 year old with a history of asthma, Sturge-Weber, seizure disorder, and Covid pneumonia who presented with progressively worsening cough and shortness of breath over 1 week.  He registered a fever to 102 F at home.  He had been seen in urgent care and was placed on Augmentin and in a subsequent visit this was changed to Levaquin with steroids.  He reports very poor appetite with nausea and vomiting often times set off by coughing spells.  In the ED a Covid screen was negative.  CTa of the chest noted multifocal pneumonia with lower lobe predominance.  Patient was started on empiric antibiotic and admitted to the hospital.  Significant Events: 5/22 admit via [place]  Antimicrobials:  Vancomycin 5/22 > 5/24 Zosyn 5/22 > Flagyl 5/22 >  Subjective: Less shortness of breath today with more comfortable respirations.  Feels very weak in general.  Overall feels that he is stabilizing some.  Denies nausea vomiting or diarrhea.  Required assistance from staff to get out of bed due to severe generalized weakness this morning.  Assessment & Plan:  Sepsis due to pneumonia Continue broad-spectrum empiric antibiotic coverage but discontinue vancomycin with negative MRSA screen -follow-up CXR today shows stability compared to yesterday -clinically appears to be leveling off -mobilize -push incentive spirometer  Acute asthma exacerbation Resolved with no wheezing on exam today -discontinue systemic steroid after last dose tomorrow  Hypokalemia Due to poor intake and acute illness -continue supplementation until stable  Nausea and vomiting with diarrhea Resolved   Coagulopathy INR elevated at 1.6 at time of admission - ?due to carbamazepine versus coagulopathy related to sepsis -assess intermittently  Sturge-Weber and seizure disorder Continue home medications -carbamazepine dose to be  adjusted by pharmacy as needed  Normocytic anemia No evidence of acute blood loss -hemoglobin stable -likely secondary to indolent recent inflammatory process/infection   DVT prophylaxis: Lovenox Code Status: FULL CODE Family Communication: Spoke with father at bedside at length Status is: Inpatient  Remains inpatient appropriate because:IV treatments appropriate due to intensity of illness or inability to take PO   Dispo: The patient is from: Home              Anticipated d/c is to: Home              Anticipated d/c date is: 2 days              Patient currently is not medically stable to d/c.   Consultants:  none  Objective: Blood pressure 121/76, pulse 100, temperature 100.1 F (37.8 C), temperature source Oral, resp. rate 18, height 6\' 1"  (1.854 m), weight 80 kg, SpO2 98 %.  Intake/Output Summary (Last 24 hours) at 01/27/2020 0942 Last data filed at 01/27/2020 0538 Gross per 24 hour  Intake --  Output 1100 ml  Net -1100 ml   Filed Weights   01/24/20 0219  Weight: 80 kg    Examination: General: No acute respiratory distress - resting comfortably Lungs: Diffuse crackles bilateral air fields more prominent in bases-no wheeze Cardiovascular: RRR Abdomen: NT/ND, soft, BS positive, no rebound, no ascites Extremities: No edema bilateral lower extremities  CBC: Recent Labs  Lab 01/24/20 0416 01/26/20 0300 01/27/20 0343  WBC 9.7 10.5 8.1  HGB 12.1* 11.7* 11.8*  HCT 36.2* 34.6* 35.2*  MCV 96.8 94.8 94.9  PLT 390 451* [id number]*   Basic Metabolic Panel: Recent Labs  Lab  01/24/20 0416 01/26/20 0300 01/27/20 0343  NA 138 138 135  K 3.3* 3.7 3.3*  CL 101 104 99  CO2 25 23 26   GLUCOSE 116* 150* 108*  BUN 11 13 8   CREATININE 0.97 0.68 0.72  CALCIUM 8.8* 8.7* 8.3*  MG  --  2.0  --    GFR: Estimated Creatinine Clearance: 162.3 mL/min (by C-G formula based on SCr of 0.72 mg/dL).  Liver Function Tests: Recent Labs  Lab 01/24/20 0940 01/27/20 0343  AST 25 42*   ALT 38 44  ALKPHOS 84 82  BILITOT 1.7* 1.5*  PROT 6.8 6.3*  ALBUMIN 2.5* 2.3*    Coagulation Profile: Recent Labs  Lab 01/24/20 0416 01/26/20 0300 01/27/20 0343  INR 1.6* 1.3* 1.4*     Recent Results (from the past 240 hour(s))  Urine culture     Status: None   Collection Time: 01/24/20  4:10 AM   Specimen: In/Out Cath Urine  Result Value Ref Range Status   Specimen Description IN/OUT CATH URINE  Final   Special Requests NONE  Final   Culture   Final    NO GROWTH Performed at [facility], [place] [address], [place], [place] [address]    Report Status 01/25/2020 FINAL  Final  Blood Culture (routine x 2)     Status: None (Preliminary result)   Collection Time: 01/24/20  4:13 AM   Specimen: BLOOD  Result Value Ref Range Status   Specimen Description BLOOD RIGHT ARM  Final   Special Requests   Final    BOTTLES DRAWN AEROBIC AND ANAEROBIC Blood Culture adequate volume   Culture   Final    NO GROWTH 2 DAYS Performed at [facility], [place] [address], [place], [place] [address]    Report Status PENDING  Incomplete  Blood Culture (routine x 2)     Status: None (Preliminary result)   Collection Time: 01/24/20  4:14 AM   Specimen: BLOOD  Result Value Ref Range Status   Specimen Description BLOOD LEFT ARM  Final   Special Requests   Final    BOTTLES DRAWN AEROBIC ONLY Blood Culture results may not be optimal due to an excessive volume of blood received in culture bottles   Culture   Final    NO GROWTH 2 DAYS Performed at [facility], [place] [address], [place], [place] [address]    Report Status PENDING  Incomplete  SARS Coronavirus 2 by RT PCR (hospital order, performed in [facility] hospital lab) Nasopharyngeal Nasopharyngeal Swab     Status: None   Collection Time: 01/24/20  4:15 AM   Specimen: Nasopharyngeal Swab  Result Value Ref Range Status   SARS Coronavirus 2 NEGATIVE NEGATIVE Final    Comment: (NOTE) SARS-CoV-2 target nucleic acids are NOT  DETECTED. The SARS-CoV-2 RNA is generally detectable in upper and lower respiratory specimens during the acute phase of infection. The lowest concentration of SARS-CoV-2 viral copies this assay can detect is 250 copies / mL. A negative result does not preclude SARS-CoV-2 infection and should not be used as the sole basis for treatment or other patient management decisions.  A negative result may occur with improper specimen collection / handling, submission of specimen other than nasopharyngeal swab, presence of viral mutation(s) within the areas targeted by this assay, and inadequate number of viral copies (<250 copies / mL). A negative result must be combined with clinical observations, patient history, and epidemiological information. Fact Sheet for Patients:  StrictlyIdeas.no Fact Sheet for Healthcare Providers: BankingDealers.co.za This test is not yet approved or cleared  by the [place] FDA and has been authorized for detection and/or diagnosis of SARS-CoV-2 by FDA under an Emergency Use Authorization (EUA).  This EUA will remain in effect (meaning this test can be used) for the duration of the COVID-19 declaration under Section 564(b)(1) of the Act, 21 U.S.C. section 360bbb-3(b)(1), unless the authorization is terminated or revoked sooner. Performed at [facility], [place] [address], [place], [place] [address]   Culture, sputum-assessment     Status: None   Collection Time: 01/25/20  5:45 AM   Specimen: Expectorated Sputum  Result Value Ref Range Status   Specimen Description EXPECTORATED SPUTUM  Final   Special Requests NONE  Final   Sputum evaluation   Final    Sputum specimen not acceptable for testing.  Please recollect.   Gram Stain Report Called to,Read Back By and Verified With: D. ANGELITO RN, AT [id number] 01/25/20 BY Rush Landmark Performed at [facility], [place] [address] [place], [place] [address]    Report  Status 01/25/2020 FINAL  Final  MRSA PCR Screening     Status: None   Collection Time: 01/26/20  4:31 PM   Specimen: Nasal Mucosa; Nasopharyngeal  Result Value Ref Range Status   MRSA by PCR NEGATIVE NEGATIVE Final    Comment:        The GeneXpert MRSA Assay (FDA approved for NASAL specimens only), is one component of a comprehensive MRSA colonization surveillance program. It is not intended to diagnose MRSA infection nor to guide or monitor treatment for MRSA infections. Performed at [facility], [place] [address], [place], [place] [address]      Scheduled Meds: . arformoterol  15 mcg Nebulization BID  . budesonide (PULMICORT) nebulizer solution  0.5 mg Nebulization BID  . carbamazepine  600 mg Oral Daily   And  . carbamazepine  900 mg Oral QHS  . enoxaparin (LOVENOX) injection  40 mg Subcutaneous Q24H  . guaiFENesin  600 mg Oral BID  . predniSONE  40 mg Oral Q breakfast  . sodium chloride flush  3 mL Intravenous Once   Continuous Infusions: . sodium chloride 10 mL/hr at 01/26/20 1733  . ceFEPime (MAXIPIME) IV 2 g (01/27/20 0513)  . vancomycin 1,000 mg (01/27/20 0512)     LOS: 3 days   Cherene Altes, MD [facility] Office  [phone] Pager - Text Page per Amion  If 7PM-7AM, please contact night-coverage per Amion 01/27/2020, 9:42 AM

## 2020-01-28 DIAGNOSIS — J9601 Acute respiratory failure with hypoxia: Secondary | ICD-10-CM

## 2020-01-28 DIAGNOSIS — D473 Essential (hemorrhagic) thrombocythemia: Secondary | ICD-10-CM

## 2020-01-28 LAB — CARBAMAZEPINE LEVEL, TOTAL: Carbamazepine Lvl: 13.9 ug/mL — ABNORMAL HIGH (ref 4.0–12.0)

## 2020-01-28 MED ORDER — ALBUTEROL SULFATE (2.5 MG/3ML) 0.083% IN NEBU
2.5000 mg | INHALATION_SOLUTION | Freq: Two times a day (BID) | RESPIRATORY_TRACT | Status: DC
  Administered 2020-01-28 – 2020-01-30 (×4): 2.5 mg via RESPIRATORY_TRACT
  Filled 2020-01-28 (×4): qty 3

## 2020-01-28 MED ORDER — METHYLPREDNISOLONE SODIUM SUCC 125 MG IJ SOLR
60.0000 mg | Freq: Three times a day (TID) | INTRAMUSCULAR | Status: DC
  Administered 2020-01-28 – 2020-01-30 (×6): 60 mg via INTRAVENOUS
  Filled 2020-01-28 (×5): qty 2

## 2020-01-28 NOTE — Plan of Care (Signed)

## 2020-01-28 NOTE — Progress Notes (Addendum)
Occupational Therapy Evaluation Patient Details Name: Steven Golden MRN: [id number] DOB: [birth year] Today's Date: 01/28/2020    History of Present Illness 24 year old with a history of asthma, Sturge-Weber, seizure disorder, and Covid pneumonia who presented with progressively worsening cough and shortness of breath over 1 week.  He registered a fever to 102 F at home.  He had been seen in urgent care and was placed on Augmentin and in a subsequent visit this was changed to Levaquin with steroids.  He reports very poor appetite with nausea and vomiting often times set off by coughing spells.   Clinical Impression   Prior to hospitalization, pt was Independent with all ADLs/IADLs, driving, and working full-time in a [employer]. Pt admitted for above and limited by decreased activity tolerance, decreased balance, and decreased muscle strength. Today, pt semi-reclined in bed upon arrival on 5L of O2. Father present at bedside. Pt did not c/o pain or tingling/numbness and reported feeling less nauseous. Monitored SPO2 throughout eval- 96% on 5L of O2 in supine/sitting and dropped into mid 80's on room air during functional activity. Took 2 minutes to recover into 90s with supplemental oxygen donned. Pt fearful of losing his breath and feeling weak while moving around. Educated pt/father on deep breathing techniques and energy conservation. Assessed muscle strength, sitting/standing balance, endurance, grooming, and toileting. Pt requires supervision-min guard for standing BADLs secondary to SOB and BLE weakness. Pt would benefit from continued skilled OT services to address more complex ADLs/IADLs safely. Will continue to follow acutely as able.    Follow Up Recommendations  Home health OT;Supervision/Assistance - 24 hour(for safety d/t decreased activity tolerance; to manage SPO2)    Equipment Recommendations  None recommended by OT    Recommendations for Other Services       Precautions /  Restrictions Precautions Precautions: Fall(watch SPO2 ) Precaution Comments: watch SPO2 Restrictions Weight Bearing Restrictions: No      Mobility Bed Mobility Overal bed mobility: Modified Independent             General bed mobility comments: extended time taken secondary to slight chest tightness and fatigue  Transfers Overall transfer level: Needs assistance Equipment used: None Transfers: Sit to/from Stand Sit to Stand: Supervision;Min guard         General transfer comment: c/o minor BLE pain/fatigue in standing; educated pt on leg pumps throughout day to promte blood flow    Balance Overall balance assessment: Mild deficits observed, not formally tested                                         ADL either performed or assessed with clinical judgement   ADL Overall ADL's : Needs assistance/impaired Eating/Feeding: Independent;Set up;Sitting   Grooming: Wash/dry hands;Wash/dry face;Oral care;Supervision/safety;Cueing for safety;Standing   Upper Body Bathing: Supervision/ safety;Set up;Sitting;Standing   Lower Body Bathing: Supervison/ safety;Set up;Sitting/lateral leans;Sit to/from stand   Upper Body Dressing : Independent;Sitting   Lower Body Dressing: Supervision/safety;Sitting/lateral leans;Sit to/from stand   Toilet Transfer: Supervision/safety;Ambulation;Regular Toilet   Toileting- Water quality scientist and Hygiene: Independent;Sitting/lateral lean;Sit to/from stand   Tub/ Shower Transfer: Supervision/safety;Set up;Ambulation;Shower seat   Functional mobility during ADLs: Supervision/safety;Min guard;Cueing for safety General ADL Comments: Pt able to ambulate to/from bathroom to perform toilet transfer and grooming standing at sink with supervision and v/c's to incorporate deep breathing for safety.      Vision Baseline Vision/History: (wears contacts)  Perception     Praxis      Pertinent Vitals/Pain Pain Assessment:  No/denies pain     Hand Dominance Right   Extremity/Trunk Assessment Upper Extremity Assessment Upper Extremity Assessment: Overall WFL for tasks assessed   Lower Extremity Assessment Lower Extremity Assessment: Defer to PT evaluation   Cervical / Trunk Assessment Cervical / Trunk Assessment: Normal   Communication Communication Communication: No difficulties   Cognition Arousal/Alertness: Awake/alert Behavior During Therapy: WFL for tasks assessed/performed;Anxious(somewhat anxious with movement) Overall Cognitive Status: Within Functional Limits for tasks assessed                                 General Comments: states that he feels better mentally/physically   General Comments  monitored SPO2 throughout evaluation; pt on 5L of O2 upon arrival maintaining O2 at 96%; taken of supplemental oxygen for functional activity throughout bedroom; O2 dropped into mid 80's; 2 minute recovery into the 90's in seated position with 5L of O2.    Exercises     Shoulder Instructions      Home Living Family/patient expects to be discharged to:: Private residence Living Arrangements: Parent Available Help at Discharge: Family Type of Home: House Home Access: Stairs to enter Technical brewer of Steps: 8   Home Layout: Able to live on main level with bedroom/bathroom     Bathroom Shower/Tub: Tub/shower unit;Walk-in shower(plans on using walk-in shower with built in bench)   Bathroom Toilet: [place]: None          Prior Functioning/Environment Level of Independence: Independent        Comments: Works at [employer] List: Decreased strength;Decreased activity tolerance;Impaired balance (sitting and/or standing);Decreased safety awareness      OT Treatment/Interventions:      OT Goals(Current goals can be found in the care plan section) Acute Rehab OT Goals Patient Stated Goal: to return home with family and not lose  his breath so easily OT Goal Formulation: With patient/family Time For Goal Achievement: 02/11/20 Potential to Achieve Goals: Good ADL Goals Pt Will Perform Grooming: with modified independence;standing Additional ADL Goal #1: Pt will perform toilet transfer and hygiene with Mod I for extended time Additional ADL Goal #2: Pt will perform all bathing activities with Mod I using shower chair for support if needed Additional ADL Goal #3: Pt will perform light IADLs associated with job tasks required in typical work environment Additional ADL Goal #4: Pt will verbalize/demonstrate 3 energy conservation techniques to conserve O2 while participating in ADLs/functional mobility  OT Frequency: Min 2X/week   Barriers to D/C:            Co-evaluation              AM-PAC OT "6 Clicks" Daily Activity     Outcome Measure Help from another person eating meals?: None Help from another person taking care of personal grooming?: None Help from another person toileting, which includes using toliet, bedpan, or urinal?: A Little Help from another person bathing (including washing, rinsing, drying)?: A Little Help from another person to put on and taking off regular upper body clothing?: None Help from another person to put on and taking off regular lower body clothing?: A Little 6 Click Score: 21   End of Session Nurse Communication: Mobility status  Activity Tolerance: Patient tolerated treatment well;Patient limited by fatigue Patient  left: in chair;with call bell/phone within reach;with family/visitor present  OT Visit Diagnosis: Unsteadiness on feet (R26.81);Muscle weakness (generalized) (M62.81)                Time: [id number] OT Time Calculation (min): 28 min Charges:  OT General Charges $OT Visit: 1 Visit OT Evaluation $OT Eval Moderate Complexity: 1 Mod  Michel Bickers, OTR/L [facility] [phone]  Francesca Jewett 01/28/2020, 11:49 AM

## 2020-01-28 NOTE — Plan of Care (Signed)
  Problem: Education: Goal: Knowledge of General Education information will improve Description: Including pain rating scale, medication(s)/side effects and non-pharmacologic comfort measures Outcome: Progressing   Problem: Health Behavior/Discharge Planning: Goal: Ability to manage health-related needs will improve Outcome: Progressing   Problem: Clinical Measurements: Goal: Ability to maintain clinical measurements within normal limits will improve Outcome: Progressing Goal: Respiratory complications will improve Outcome: Progressing Goal: Cardiovascular complication will be avoided Outcome: Progressing   Problem: Activity: Goal: Risk for activity intolerance will decrease Outcome: Progressing   Problem: Elimination: Goal: Will not experience complications related to bowel motility Outcome: Progressing Goal: Will not experience complications related to urinary retention Outcome: Progressing   Problem: Pain Managment: Goal: General experience of comfort will improve Outcome: Progressing   Problem: Safety: Goal: Ability to remain free from injury will improve Outcome: Progressing   Problem: Skin Integrity: Goal: Risk for impaired skin integrity will decrease Outcome: Progressing

## 2020-01-28 NOTE — Progress Notes (Signed)
Ok to add stop date of 7d for cefepime per Dr Erlinda Hong.  Onnie Boer, PharmD, BCIDP, AAHIVP, CPP Infectious Disease Pharmacist 01/28/2020 1:07 PM

## 2020-01-28 NOTE — Evaluation (Addendum)
Physical Therapy Evaluation Patient Details Name: Steven Golden MRN: [id number] DOB: [birth year] Today's Date: 01/28/2020   History of Present Illness  24 year old with a history of asthma, Sturge-Weber, seizure disorder, and Covid pneumonia who presented with progressively worsening cough and shortness of breath over 1 week.  He registered a fever to 102 F at home.  He had been seen in urgent care and was placed on Augmentin and in a subsequent visit this was changed to Levaquin with steroids.  He reports very poor appetite with nausea and vomiting often times set off by coughing spells.  Clinical Impression  Prior to admission, pt independent and working. Presents with significantly decreased cardiopulmonary endurance, weakness, and mild balance impairments. Ambulating 100 feet with no assistive device at a min guard assist level. SpO2 94-96% on 3L O2, HR peak 126 bpm. Education provided on IS use, progressive mobility, and endurance conservation strategies. Will follow acutely to address deficits.    Follow Up Recommendations Outpatient PT    Equipment Recommendations  None recommended by PT    Recommendations for Other Services       Precautions / Restrictions Precautions Precautions: Other (comment) Precaution Comments: SpO2 Restrictions Weight Bearing Restrictions: No      Mobility  Bed Mobility Overal bed mobility: Independent               Transfers Overall transfer level: Independent Equipment used: None         Ambulation/Gait Ambulation/Gait assistance: Min Conservator, museum/gallery (Feet): 100 Feet Assistive device: None Gait Pattern/deviations: Step-through pattern;Decreased stride length Gait velocity: decreased   General Gait Details: Slow pace for age and mildly unsteady. Min guard for safety and Product/process development scientist    Modified Rankin (Stroke Patients Only)       Balance Overall balance assessment: Mild  deficits observed, not formally tested                                           Pertinent Vitals/Pain Pain Assessment: No/denies pain    Home Living Family/patient expects to be discharged to:: Private residence Living Arrangements: Parent Available Help at Discharge: Family Type of Home: House Home Access: Stairs to enter   Technical brewer of Steps: 8 Home Layout: Able to live on main level with bedroom/bathroom Home Equipment: None      Prior Function Level of Independence: Independent         Comments: Works at [employer]: Right    Extremity/Trunk Assessment   Upper Extremity Assessment Upper Extremity Assessment: Overall WFL for tasks assessed    Lower Extremity Assessment Lower Extremity Assessment: RLE deficits/detail;LLE deficits/detail RLE Deficits / Details: Grossly 4+/5 LLE Deficits / Details: Grossly 4+/5    Cervical / Trunk Assessment Cervical / Trunk Assessment: Normal  Communication   Communication: No difficulties  Cognition Arousal/Alertness: Awake/alert Behavior During Therapy: WFL for tasks assessed/performed Overall Cognitive Status: Within Functional Limits for tasks assessed                                 General Comments: states that he feels better mentally/physically      General Comments General comments (skin integrity, edema, etc.): monitored SPO2 throughout evaluation;  pt on 5L of O2 upon arrival maintaining O2 at 96%; taken of supplemental oxygen for functional activity throughout bedroom; O2 dropped into mid 80's; 2 minute recovery into the 90's in seated position with 5L of O2.    Exercises     Assessment/Plan    PT Assessment Patient needs continued PT services  PT Problem List Decreased strength;Decreased activity tolerance;Decreased mobility;Decreased balance       PT Treatment Interventions Gait training;Stair training;Functional mobility  training;Therapeutic activities;Therapeutic exercise;Balance training;Patient/family education    PT Goals (Current goals can be found in the Care Plan section)  Acute Rehab PT Goals Patient Stated Goal: to return home with family and not lose his breath so easily PT Goal Formulation: With patient Time For Goal Achievement: 02/11/20 Potential to Achieve Goals: Good    Frequency Min 5X/week   Barriers to discharge        Co-evaluation               AM-PAC PT "6 Clicks" Mobility  Outcome Measure Help needed turning from your back to your side while in a flat bed without using bedrails?: None Help needed moving from lying on your back to sitting on the side of a flat bed without using bedrails?: None Help needed moving to and from a bed to a chair (including a wheelchair)?: None Help needed standing up from a chair using your arms (e.g., wheelchair or bedside chair)?: None Help needed to walk in hospital room?: A Little Help needed climbing 3-5 steps with a railing? : A Little 6 Click Score: 22    End of Session Equipment Utilized During Treatment: Oxygen Activity Tolerance: Patient tolerated treatment well Patient left: in bed;with call bell/phone within reach;with family/visitor present Nurse Communication: Mobility status PT Visit Diagnosis: Difficulty in walking, not elsewhere classified (R26.2);Unsteadiness on feet (R26.81)    Time: [id number] PT Time Calculation (min) (ACUTE ONLY): 26 min   Charges:   PT Evaluation $PT Eval Moderate Complexity: 1 Mod PT Treatments $Therapeutic Activity: 8-22 mins          Wyona Almas, PT, DPT Acute Rehabilitation Services Pager [phone] Office [phone]   Steven Golden 01/28/2020, 1:34 PM

## 2020-01-28 NOTE — Progress Notes (Signed)
PROGRESS NOTE    Steven Golden  [id number] DOB: [birth year] DOA: 01/24/2020 PCP: Raina Mina., MD    Chief Complaint  Patient presents with  . Chest Pain    Brief Narrative:  24 year old with a history of asthma, Sturge-Weber, seizure disorder, and Covid pneumonia who presented with progressively worsening cough and shortness of breath over 1 week.  He registered a fever to 102 F at home.  He had been seen in urgent care and was placed on Augmentin and in a subsequent visit this was changed to Levaquin with steroids.  He reports very poor appetite with nausea and vomiting often times set off by coughing spells.  In the ED a Covid screen was negative.  CTa of the chest noted multifocal pneumonia with lower lobe predominance.  Patient was started on empiric antibiotic and admitted to the hospital.  Subjective:   Feels tight, not able to cough up easily, phlegm is very thick if cough up Remain oxygen dependent, o2 dropped to the 80's on room air per patient's report He continue to feel weak Mother at bedside   carbamazepine level recheck,  Sturge-Weber Seizure x1 aweek ago. Follows Dr Brayton Caves  Assessment & Plan:   Principal Problem:   Sepsis due to pneumonia [facility]) Active Problems:   Persistent asthma with acute exacerbation   Nausea, vomiting, and diarrhea   Hypokalemia   Sturge-Weber syndrome (HCC)   Seizure disorder (HCC)   Normocytic anemia   Marijuana use  Sepsis presents on admission with Multifocal pneumonia/astham exacerbation/acute hypoxic respiratory failure - reports Seizure a week ago, then vomited, then sob/fever, was seen in the ED three times for sob and was sent home -Also reports vaping -Still very tight on exam, restart solumedrol, add on scheduled nebs in addition to prn nebs, continue mucinex, abx, collect sputum sample if able  Coagulopathy from sepsis? Repeat inr in am  Thrombocytosis, reactive? Monitor  Normocytic anemia; stable above 11,  monitor   Hypokalemia Remain low , continue to replace, check mag  N/v/d Resolved  Sturge-Weber and seizure disorder Reports seizure a week ago Monitor Carbamazepine level  Body mass index is 23.27 kg/m.   DVT prophylaxis: lovenox Code Status:full Family Communication: monther Disposition:   Status is: Inpatient     Dispo: The patient is from: home              Anticipated d/c is to: home              Anticipated d/c date is: 24-48hrs              Patient currently remain oxygen dependent  Consultants:   none  Procedures:   none  Antimicrobials:   cefepime     Objective: Vitals:   01/27/20 2031 01/28/20 0422 01/28/20 0827 01/28/20 1436  BP: 129/72 126/69  122/68  Pulse: 87 (!) 102 97 83  Resp: 20 19 15 17   Temp: 98.5 F (36.9 C) 99.8 F (37.7 C)  98.5 F (36.9 C)  TempSrc: Oral Oral  Oral  SpO2:  97% 98% 96%  Weight:      Height:        Intake/Output Summary (Last 24 hours) at 01/28/2020 1642 Last data filed at 01/28/2020 1356 Gross per 24 hour  Intake 1337.81 ml  Output 850 ml  Net 487.81 ml   Filed Weights   01/24/20 0219  Weight: 80 kg    Examination:  General exam: calm, NAD Respiratory system: diminished overall, no wheezing, Respiratory effort  normal. Cardiovascular system: S1 & S2 heard, RRR. No JVD, no murmur, No pedal edema. Gastrointestinal system: Abdomen is nondistended, soft and nontender. No organomegaly or masses felt. Normal bowel sounds heard. Central nervous system: Alert and oriented. No focal neurological deficits. Extremities: Symmetric 5 x 5 power. Skin: No rashes, lesions or ulcers Psychiatry: Judgement and insight appear normal. Mood & affect appropriate.     Data Reviewed: I have personally reviewed following labs and imaging studies  CBC: Recent Labs  Lab 01/24/20 0416 01/26/20 0300 01/27/20 0343  WBC 9.7 10.5 8.1  HGB 12.1* 11.7* 11.8*  HCT 36.2* 34.6* 35.2*  MCV 96.8 94.8 94.9  PLT 390 451* 437*     Basic Metabolic Panel: Recent Labs  Lab 01/24/20 0416 01/26/20 0300 01/27/20 0343  NA 138 138 135  K 3.3* 3.7 3.3*  CL 101 104 99  CO2 25 23 26   GLUCOSE 116* 150* 108*  BUN 11 13 8   CREATININE 0.97 0.68 0.72  CALCIUM 8.8* 8.7* 8.3*  MG  --  2.0  --     GFR: Estimated Creatinine Clearance: 162.3 mL/min (by C-G formula based on SCr of 0.72 mg/dL).  Liver Function Tests: Recent Labs  Lab 01/24/20 0940 01/27/20 0343  AST 25 42*  ALT 38 44  ALKPHOS 84 82  BILITOT 1.7* 1.5*  PROT 6.8 6.3*  ALBUMIN 2.5* 2.3*    CBG: No results for input(s): GLUCAP in the last 168 hours.   Recent Results (from the past 240 hour(s))  Urine culture     Status: None   Collection Time: 01/24/20  4:10 AM   Specimen: In/Out Cath Urine  Result Value Ref Range Status   Specimen Description IN/OUT CATH URINE  Final   Special Requests NONE  Final   Culture   Final    NO GROWTH Performed at [facility], [address] [place], [place] [address]    Report Status 01/25/2020 FINAL  Final  Blood Culture (routine x 2)     Status: None (Preliminary result)   Collection Time: 01/24/20  4:13 AM   Specimen: BLOOD  Result Value Ref Range Status   Specimen Description BLOOD RIGHT ARM  Final   Special Requests   Final    BOTTLES DRAWN AEROBIC AND ANAEROBIC Blood Culture adequate volume   Culture   Final    NO GROWTH 4 DAYS Performed at [facility], [place] [address] [place], [place] [address]    Report Status PENDING  Incomplete  Blood Culture (routine x 2)     Status: None (Preliminary result)   Collection Time: 01/24/20  4:14 AM   Specimen: BLOOD  Result Value Ref Range Status   Specimen Description BLOOD LEFT ARM  Final   Special Requests   Final    BOTTLES DRAWN AEROBIC ONLY Blood Culture results may not be optimal due to an excessive volume of blood received in culture bottles   Culture   Final    NO GROWTH 4 DAYS Performed at [facility], [place] [address]  [place], [place] [address]    Report Status PENDING  Incomplete  SARS Coronavirus 2 by RT PCR (hospital order, performed in [facility] hospital lab) Nasopharyngeal Nasopharyngeal Swab     Status: None   Collection Time: 01/24/20  4:15 AM   Specimen: Nasopharyngeal Swab  Result Value Ref Range Status   SARS Coronavirus 2 NEGATIVE NEGATIVE Final    Comment: (NOTE) SARS-CoV-2 target nucleic acids are  NOT DETECTED. The SARS-CoV-2 RNA is generally detectable in upper and lower respiratory specimens during the acute phase of infection. The lowest concentration of SARS-CoV-2 viral copies this assay can detect is 250 copies / mL. A negative result does not preclude SARS-CoV-2 infection and should not be used as the sole basis for treatment or other patient management decisions.  A negative result may occur with improper specimen collection / handling, submission of specimen other than nasopharyngeal swab, presence of viral mutation(s) within the areas targeted by this assay, and inadequate number of viral copies (<250 copies / mL). A negative result must be combined with clinical observations, patient history, and epidemiological information. Fact Sheet for Patients:   StrictlyIdeas.no Fact Sheet for Healthcare Providers: BankingDealers.co.za This test is not yet approved or cleared  by the [place] FDA and has been authorized for detection and/or diagnosis of SARS-CoV-2 by FDA under an Emergency Use Authorization (EUA).  This EUA will remain in effect (meaning this test can be used) for the duration of the COVID-19 declaration under Section 564(b)(1) of the Act, 21 U.S.C. section 360bbb-3(b)(1), unless the authorization is terminated or revoked sooner. Performed at [facility], [place] [address] [place], [place] [address]   Culture, sputum-assessment     Status: None   Collection Time: 01/25/20  5:45 AM   Specimen: Expectorated Sputum    Result Value Ref Range Status   Specimen Description EXPECTORATED SPUTUM  Final   Special Requests NONE  Final   Sputum evaluation   Final    Sputum specimen not acceptable for testing.  Please recollect.   Gram Stain Report Called to,Read Back By and Verified With: D. ANGELITO RN, AT [id number] 01/25/20 BY Rush Landmark Performed at [facility], [place] [address], [place], [place] [address]    Report Status 01/25/2020 FINAL  Final  MRSA PCR Screening     Status: None   Collection Time: 01/26/20  4:31 PM   Specimen: Nasal Mucosa; Nasopharyngeal  Result Value Ref Range Status   MRSA by PCR NEGATIVE NEGATIVE Final    Comment:        The GeneXpert MRSA Assay (FDA approved for NASAL specimens only), is one component of a comprehensive MRSA colonization surveillance program. It is not intended to diagnose MRSA infection nor to guide or monitor treatment for MRSA infections. Performed at [facility], [place] [address] [place],  [address]          Radiology Studies: DG Chest Port 1 View  Result Date: 01/27/2020 CLINICAL DATA:  Pneumonia EXAM: PORTABLE CHEST 1 VIEW COMPARISON:  Jan 26, 2020. FINDINGS: There is patchy airspace opacity in both lower lung regions, essentially stable. Interstitium remains thickened. No new opacity evident. Heart size and pulmonary vascular normal. No adenopathy. No bone lesions. IMPRESSION: Stable patchy airspace opacity in both lower lung regions. Stable interstitial wall. No new opacity. Stable cardiac silhouette. Electronically Signed   By: Lowella Grip III M.D.   On: 01/27/2020 08:00        Scheduled Meds: . albuterol  2.5 mg Nebulization BID  . arformoterol  15 mcg Nebulization BID  . budesonide (PULMICORT) nebulizer solution  0.5 mg Nebulization BID  . carbamazepine  600 mg Oral Daily   And  . carbamazepine  900 mg Oral QHS  . enoxaparin (LOVENOX) injection  40 mg Subcutaneous Q24H  . guaiFENesin  600 mg Oral BID  .  methylPREDNISolone (SOLU-MEDROL) injection  60 mg Intravenous Q8H  .  sodium chloride flush  3 mL Intravenous Once   Continuous Infusions: . sodium chloride 10 mL/hr at 01/28/20 0300  . ceFEPime (MAXIPIME) IV 2 g (01/28/20 1313)     LOS: 4 days     Time spent: 38mins I have personally reviewed and interpreted on  01/28/2020 daily labs, tele strips, imagings as discussed above under date review session and assessment and plans.  I reviewed all nursing notes, pharmacy notes,   vitals, pertinent old records  I have discussed plan of care as described above with RN , patient and family on 01/28/2020  Voice Recognition /Dragon dictation system was used to create this note, attempts have been made to correct errors. Please contact the author with questions and/or clarifications.   Florencia Reasons, MD PhD FACP [facility]  Available via Epic secure chat 7am-7pm for nonurgent issues Please page for urgent issues To page the attending provider between 7A-7P or the covering provider during after hours 7P-7A, please log into the web site [email or web address] and access using universal [place] password for that web site. If you do not have the password, please call the hospital operator.    01/28/2020, 4:42 PM

## 2020-01-29 LAB — CBC WITH DIFFERENTIAL/PLATELET
Abs Immature Granulocytes: 0.04 10*3/uL (ref 0.00–0.07)
Basophils Absolute: 0 10*3/uL (ref 0.0–0.1)
Basophils Relative: 0 %
Eosinophils Absolute: 0 10*3/uL (ref 0.0–0.5)
Eosinophils Relative: 0 %
HCT: 39.9 % (ref 39.0–52.0)
Hemoglobin: 13.3 g/dL (ref 13.0–17.0)
Immature Granulocytes: 1 %
Lymphocytes Relative: 10 %
Lymphs Abs: 0.6 10*3/uL — ABNORMAL LOW (ref 0.7–4.0)
MCH: 31.6 pg (ref 26.0–34.0)
MCHC: 33.3 g/dL (ref 30.0–36.0)
MCV: 94.8 fL (ref 80.0–100.0)
Monocytes Absolute: 0.2 10*3/uL (ref 0.1–1.0)
Monocytes Relative: 3 %
Neutro Abs: 5.5 10*3/uL (ref 1.7–7.7)
Neutrophils Relative %: 86 %
Platelets: 548 10*3/uL — ABNORMAL HIGH (ref 150–400)
RBC: 4.21 MIL/uL — ABNORMAL LOW (ref 4.22–5.81)
RDW: 10.7 % — ABNORMAL LOW (ref 11.5–15.5)
WBC: 6.3 10*3/uL (ref 4.0–10.5)
nRBC: 0 % (ref 0.0–0.2)

## 2020-01-29 LAB — COMPREHENSIVE METABOLIC PANEL
ALT: 38 U/L (ref 0–44)
AST: 38 U/L (ref 15–41)
Albumin: 2.5 g/dL — ABNORMAL LOW (ref 3.5–5.0)
Alkaline Phosphatase: 99 U/L (ref 38–126)
Anion gap: 12 (ref 5–15)
BUN: 14 mg/dL (ref 6–20)
CO2: 25 mmol/L (ref 22–32)
Calcium: 8.8 mg/dL — ABNORMAL LOW (ref 8.9–10.3)
Chloride: 99 mmol/L (ref 98–111)
Creatinine, Ser: 0.72 mg/dL (ref 0.61–1.24)
GFR calc Af Amer: >60 mL/min (ref >60)
GFR calc non Af Amer: >60 mL/min (ref >60)
Glucose, Bld: 127 mg/dL — ABNORMAL HIGH (ref 70–99)
Potassium: 4.7 mmol/L (ref 3.5–5.1)
Sodium: 136 mmol/L (ref 135–145)
Total Bilirubin: 0.2 mg/dL — ABNORMAL LOW (ref 0.3–1.2)
Total Protein: 7.2 g/dL (ref 6.5–8.1)

## 2020-01-29 LAB — CULTURE, BLOOD (ROUTINE X 2)
Culture: NO GROWTH
Culture: NO GROWTH
Special Requests: ADEQUATE

## 2020-01-29 LAB — MAGNESIUM: Magnesium: 2.5 mg/dL — ABNORMAL HIGH (ref 1.7–2.4)

## 2020-01-29 LAB — PROTIME-INR
INR: 1.2 (ref 0.8–1.2)
Prothrombin Time: 14.6 seconds (ref 11.4–15.2)

## 2020-01-29 NOTE — Progress Notes (Signed)
Physical Therapy Treatment & Discharge Patient Details Name: Steven Golden MRN: [id number] DOB: 1996-01-23 Today's Date: 01/29/2020    History of Present Illness 24 year old with a PMH of asthma, Sturge-Weber, seizure disorder, and Covid pneumonia who presented to ED 01/24/20 with progressively worsening cough and shortness of breath over 1 week. Postive CTa of the chest noting multifocal pneumonia with lower lobe predominance.    PT Comments    Pt received sitting EOB without o2, spo2 94-95%. Pt reported he has had o2 off all day and felt fine. Pt able to complete all functional mobility including transfers, amb 359f and stairs independently with spo2 greater than, equal to 95% RA. Educated pt on use of incentive spirometer and importance of mobility to regaining cardiopulmonary endurance. Discussed changing discharge recommendations to no physical therapy follow up, pt agreed. Pt demonstrated ability to complete mobility safely with IV pole and is safe from a functional mobility standpoint to d/c from physical therapy acutely.    Follow Up Recommendations  No PT follow up     Equipment Recommendations  None recommended by PT    Recommendations for Other Services       Precautions / Restrictions Precautions Precautions: Other (comment) Precaution Comments: SpO2 Restrictions Weight Bearing Restrictions: No    Mobility  Bed Mobility Overal bed mobility: Independent                Transfers Overall transfer level: Independent                  Ambulation/Gait Ambulation/Gait assistance: Independent Gait Distance (Feet): 300 Feet Assistive device: None Gait Pattern/deviations: Step-through pattern;WFL(Within Functional Limits)     General Gait Details: Pt able to amb without DME and no noteable unsteadiness, pt spo2 remained above 95% RA with pt only reporting increased chest tightness with taking deep breaths. Pt demonstrated he is able to amb in hallway and in room  negotiating IV pole.   Stairs Stairs: Yes Stairs assistance: Independent Stair Management: No rails Number of Stairs: 3 General stair comments: pt able to amb up/down steps independent   Wheelchair Mobility    Modified Rankin (Stroke Patients Only)       Balance Overall balance assessment: Independent                               Standardized Balance Assessment Standardized Balance Assessment : Dynamic Gait Index   Dynamic Gait Index Level Surface: Normal Change in Gait Speed: Normal(infered) Gait with Horizontal Head Turns: Normal Gait with Vertical Head Turns: Normal Gait and Pivot Turn: Normal(infered) Step Over Obstacle: Normal Step Around Obstacles: Normal(infered) Steps: Normal Total Score: 24      Cognition Arousal/Alertness: Awake/alert Behavior During Therapy: WFL for tasks assessed/performed Overall Cognitive Status: Within Functional Limits for tasks assessed                                        Exercises      General Comments General comments (skin integrity, edema, etc.): monitored spo2 throughout session, pt remained greater than or equal to 95% RA throughout session.      Pertinent Vitals/Pain Pain Assessment: No/denies pain    Home Living                      Prior Function  PT Goals (current goals can now be found in the care plan section) Acute Rehab PT Goals PT Goal Formulation: All assessment and education complete, DC therapy Progress towards PT goals: Goals met/education completed, patient discharged from PT    Frequency           PT Plan Discharge plan needs to be updated;Frequency needs to be updated    Co-evaluation              AM-PAC PT "6 Clicks" Mobility   Outcome Measure  Help needed turning from your back to your side while in a flat bed without using bedrails?: None Help needed moving from lying on your back to sitting on the side of a flat bed  without using bedrails?: None Help needed moving to and from a bed to a chair (including a wheelchair)?: None Help needed standing up from a chair using your arms (e.g., wheelchair or bedside chair)?: None Help needed to walk in hospital room?: None Help needed climbing 3-5 steps with a railing? : None 6 Click Score: 24    End of Session   Activity Tolerance: Patient tolerated treatment well Patient left: in bed;with call bell/phone within reach Nurse Communication: Mobility status PT Visit Diagnosis: Difficulty in walking, not elsewhere classified (R26.2);Unsteadiness on feet (R26.81)     Time: 3893-7342 PT Time Calculation (min) (ACUTE ONLY): 13 min  Charges:  $Gait Training: 8-22 mins                     [employer] SPT 01/29/2020    Rolland Porter 01/29/2020, 5:33 PM

## 2020-01-29 NOTE — Progress Notes (Addendum)
PROGRESS NOTE    Steven Golden  [id number] DOB: 1996-04-04 DOA: 01/24/2020 PCP: Raina Mina., MD    Chief Complaint  Patient presents with  . Chest Pain    Brief Narrative:  24 year old with a history of asthma, Sturge-Weber, seizure disorder, and Covid pneumonia who presented with progressively worsening cough and shortness of breath over 1 week.  He registered a fever to 102 F at home.  He had been seen in urgent care and was placed on Augmentin and in a subsequent visit this was changed to Levaquin with steroids.  He reports very poor appetite with nausea and vomiting often times set off by coughing spells.  In the ED a Covid screen was negative.  CTa of the chest noted multifocal pneumonia with lower lobe predominance.  Patient was started on empiric antibiotic and admitted to the hospital.  Subjective:   Feels less  Tight, he is on room air at rest, reports less DOE, able to ambulate in room better Continue to feel weak, Reports he is 50% to his baseline Father  at bedside    Assessment & Plan:   Principal Problem:   Sepsis due to pneumonia [facility]) Active Problems:   Persistent asthma with acute exacerbation   Nausea, vomiting, and diarrhea   Hypokalemia   Sturge-Weber syndrome (HCC)   Seizure disorder (HCC)   Normocytic anemia   Marijuana use  Sepsis presents on admission with Multifocal pneumonia/astham exacerbation/acute hypoxic respiratory failure - reports Seizure a week ago, then vomited, then sob/fever, was seen in the ED three times for sob and was sent home -Also reports vaping -Blood culture no growth, MRSA screening negative, SARS-CoV-2 screen negative - exam improved today, will continue current dose of  solumedrol, continue scheduled nebs in addition to prn nebs, continue mucinex, abx, collect sputum sample if able -Consider start to taper steroid if showed continued improvement tomorrow  Abnormal liver function and Coagulopathy likely  from  sepsis normalized  Thrombocytosis, reactive? Monitor  Normocytic anemia; stable above 11, monitor   Hypokalemia Replaced, Normalized , mag 2.5 Clinically improving, DC telemetry  N/v/d Resolved  Sturge-Weber and seizure disorder Reports seizure a week ago Monitor Carbamazepine level Follows Dr Brayton Caves, attempt to call Dr Zettie Pho office with long waiting time, will try to call again, may need to consult inhouse neurology inf not able to get in touch with Dr Brayton Caves  Addendum: I was not able to get in touch with Dr. Ballard Russell office, I discussed case with in-house neurologist Dr. Lorraine Lax who recommend continue current seizure medication, no need of additional medication, recommend close follow-up with patient's neurology.  uds on presentation + opiates, + thc  Body mass index is 23.27 kg/m.   DVT prophylaxis: lovenox Code Status:full Family Communication: Father at bedside Disposition:   Status is: Inpatient     Dispo: The patient is from: home              Anticipated d/c is to: home              Anticipated d/c date is: 24-48hrs              Patient currently remain oxygen dependent  Consultants:   none  Procedures:   none  Antimicrobials:   cefepime     Objective: Vitals:   01/28/20 2020 01/28/20 2130 01/29/20 0420 01/29/20 1300  BP:  (!) 139/94 131/84 120/84  Pulse:  93 74 98  Resp:  18 16 18   Temp:  98.2 F (  36.8 C) 98.3 F (36.8 C) 98 F (36.7 C)  TempSrc:  Oral Oral Oral  SpO2: 96% 100% 100% 97%  Weight:      Height:        Intake/Output Summary (Last 24 hours) at 01/29/2020 1529 Last data filed at 01/29/2020 1020 Gross per 24 hour  Intake 750 ml  Output 625 ml  Net 125 ml   Filed Weights   01/24/20 0219  Weight: 80 kg    Examination:  General exam: calm, NAD Respiratory system: Improved aeration, no wheezing, Respiratory effort normal. Cardiovascular system: S1 & S2 heard, RRR. No JVD, no murmur, No pedal edema. Gastrointestinal  system: Abdomen is nondistended, soft and nontender. No organomegaly or masses felt. Normal bowel sounds heard. Central nervous system: Alert and oriented. No focal neurological deficits. Extremities: Symmetric 5 x 5 power. Skin: No rashes, lesions or ulcers Psychiatry: Judgement and insight appear normal. Mood & affect appropriate.     Data Reviewed: I have personally reviewed following labs and imaging studies  CBC: Recent Labs  Lab 01/24/20 0416 01/26/20 0300 01/27/20 0343 01/29/20 0334  WBC 9.7 10.5 8.1 6.3  NEUTROABS  --   --   --  5.5  HGB 12.1* 11.7* 11.8* 13.3  HCT 36.2* 34.6* 35.2* 39.9  MCV 96.8 94.8 94.9 94.8  PLT 390 451* 437* 548*    Basic Metabolic Panel: Recent Labs  Lab 01/24/20 0416 01/26/20 0300 01/27/20 0343 01/29/20 0334  NA 138 138 135 136  K 3.3* 3.7 3.3* 4.7  CL 101 104 99 99  CO2 25 23 26 25   GLUCOSE 116* 150* 108* 127*  BUN 11 13 8 14   CREATININE 0.97 0.68 0.72 0.72  CALCIUM 8.8* 8.7* 8.3* 8.8*  MG  --  2.0  --  2.5*    GFR: Estimated Creatinine Clearance: 162.3 mL/min (by C-G formula based on SCr of 0.72 mg/dL).  Liver Function Tests: Recent Labs  Lab 01/24/20 0940 01/27/20 0343 01/29/20 0334  AST 25 42* 38  ALT 38 44 38  ALKPHOS 84 82 99  BILITOT 1.7* 1.5* 0.2*  PROT 6.8 6.3* 7.2  ALBUMIN 2.5* 2.3* 2.5*    CBG: No results for input(s): GLUCAP in the last 168 hours.   Recent Results (from the past 240 hour(s))  Urine culture     Status: None   Collection Time: 01/24/20  4:10 AM   Specimen: In/Out Cath Urine  Result Value Ref Range Status   Specimen Description IN/OUT CATH URINE  Final   Special Requests NONE  Final   Culture   Final    NO GROWTH Performed at [facility], [address], [place], [place] [address]    Report Status 01/25/2020 FINAL  Final  Blood Culture (routine x 2)     Status: None   Collection Time: 01/24/20  4:13 AM   Specimen: BLOOD  Result Value Ref Range Status   Specimen Description  BLOOD RIGHT ARM  Final   Special Requests   Final    BOTTLES DRAWN AEROBIC AND ANAEROBIC Blood Culture adequate volume   Culture   Final    NO GROWTH 5 DAYS Performed at [facility], [place] [address], [place], [place] [address]    Report Status 01/29/2020 FINAL  Final  Blood Culture (routine x 2)     Status: None   Collection Time: 01/24/20  4:14 AM   Specimen: BLOOD  Result Value Ref Range Status   Specimen Description BLOOD  LEFT ARM  Final   Special Requests   Final    BOTTLES DRAWN AEROBIC ONLY Blood Culture results may not be optimal due to an excessive volume of blood received in culture bottles   Culture   Final    NO GROWTH 5 DAYS Performed at [facility], [place] [address], [place], [place] [address]    Report Status 01/29/2020 FINAL  Final  SARS Coronavirus 2 by RT PCR (hospital order, performed in [facility] hospital lab) Nasopharyngeal Nasopharyngeal Swab     Status: None   Collection Time: 01/24/20  4:15 AM   Specimen: Nasopharyngeal Swab  Result Value Ref Range Status   SARS Coronavirus 2 NEGATIVE NEGATIVE Final    Comment: (NOTE) SARS-CoV-2 target nucleic acids are NOT DETECTED. The SARS-CoV-2 RNA is generally detectable in upper and lower respiratory specimens during the acute phase of infection. The lowest concentration of SARS-CoV-2 viral copies this assay can detect is 250 copies / mL. A negative result does not preclude SARS-CoV-2 infection and should not be used as the sole basis for treatment or other patient management decisions.  A negative result may occur with improper specimen collection / handling, submission of specimen other than nasopharyngeal swab, presence of viral mutation(s) within the areas targeted by this assay, and inadequate number of viral copies (<250 copies / mL). A negative result must be combined with clinical observations, patient history, and epidemiological information. Fact Sheet for Patients:    StrictlyIdeas.no Fact Sheet for Healthcare Providers: BankingDealers.co.za This test is not yet approved or cleared  by the [place] FDA and has been authorized for detection and/or diagnosis of SARS-CoV-2 by FDA under an Emergency Use Authorization (EUA).  This EUA will remain in effect (meaning this test can be used) for the duration of the COVID-19 declaration under Section 564(b)(1) of the Act, 21 U.S.C. section 360bbb-3(b)(1), unless the authorization is terminated or revoked sooner. Performed at [facility], [place] [address], [place], [place] [address]   Culture, sputum-assessment     Status: None   Collection Time: 01/25/20  5:45 AM   Specimen: Expectorated Sputum  Result Value Ref Range Status   Specimen Description EXPECTORATED SPUTUM  Final   Special Requests NONE  Final   Sputum evaluation   Final    Sputum specimen not acceptable for testing.  Please recollect.   Gram Stain Report Called to,Read Back By and Verified With: D. ANGELITO RN, AT [id number] 01/25/20 BY Rush Landmark Performed at [facility], [place] [address] [place], [place] [address]    Report Status 01/25/2020 FINAL  Final  MRSA PCR Screening     Status: None   Collection Time: 01/26/20  4:31 PM   Specimen: Nasal Mucosa; Nasopharyngeal  Result Value Ref Range Status   MRSA by PCR NEGATIVE NEGATIVE Final    Comment:        The GeneXpert MRSA Assay (FDA approved for NASAL specimens only), is one component of a comprehensive MRSA colonization surveillance program. It is not intended to diagnose MRSA infection nor to guide or monitor treatment for MRSA infections. Performed at [facility], [place] [address], [place],  [address]          Radiology Studies: No results found.      Scheduled Meds: . albuterol  2.5 mg Nebulization BID  . arformoterol  15 mcg Nebulization BID  . budesonide (PULMICORT) nebulizer solution  0.5 mg  Nebulization BID  . carbamazepine  600 mg Oral Daily   And  . carbamazepine  900 mg Oral QHS  . enoxaparin (LOVENOX) injection  40 mg Subcutaneous Q24H  . guaiFENesin  600 mg Oral BID  . methylPREDNISolone (SOLU-MEDROL) injection  60 mg Intravenous Q8H  . sodium chloride flush  3 mL Intravenous Once   Continuous Infusions: . sodium chloride 10 mL/hr at 01/28/20 0300  . ceFEPime (MAXIPIME) IV 2 g (01/29/20 1300)     LOS: 5 days     Time spent: 72mins I have personally reviewed and interpreted on  01/29/2020 daily labs, tele strips, imagings as discussed above under date review session and assessment and plans.  I reviewed all nursing notes, pharmacy notes,   vitals, pertinent old records  I have discussed plan of care as described above with RN , patient and family on 01/29/2020  Voice Recognition /Dragon dictation system was used to create this note, attempts have been made to correct errors. Please contact the author with questions and/or clarifications.   Florencia Reasons, MD PhD FACP [facility]  Available via Epic secure chat 7am-7pm for nonurgent issues Please page for urgent issues To page the attending provider between 7A-7P or the covering provider during after hours 7P-7A, please log into the web site [email or web address] and access using universal [place] password for that web site. If you do not have the password, please call the hospital operator.    01/29/2020, 3:29 PM

## 2020-01-29 NOTE — TOC Progression Note (Signed)
Transition of Care [facility]) - Progression Note    Patient Details  Name: Steven Golden MRN: [id number] Date of Birth: 13-Feb-1996  Transition of Care [facility]) CM/SW Contact  Jacalyn Lefevre Edson Snowball, RN Phone Number: 01/29/2020, 10:23 AM  Clinical Narrative:    PT recommendation for OP PT. Patient lives in [place].   Clarene Critchley with [facility] faxed at referral form to [facility].   Spoke to patient and his father at bedside. Discussed PT recommendation for OP PT. Discussed locations including [place]. Patient currently up ambulating in hospital room.  Patient and his father would like to see how patient progresses before deciding on OP PT.   NCM will continue to follow for OP PT and possible oxygen needs.   Expected Discharge Plan: Home/Self Care    Expected Discharge Plan and Services Expected Discharge Plan: Home/Self Care       Living arrangements for the past 2 months: Single Family Home                   DME Agency: NA       HH Arranged: NA           Social Determinants of Health (SDOH) Interventions    Readmission Risk Interventions Readmission Risk Prevention Plan 01/27/2020  Transportation Screening Complete  PCP or Specialist Appt within 5-7 Days Complete  Home Care Screening Complete  Medication Review (RN CM) Referral to Pharmacy  Some recent data might be hidden

## 2020-01-30 ENCOUNTER — Encounter (HOSPITAL_COMMUNITY): Payer: Self-pay

## 2020-01-30 LAB — CBC WITH DIFFERENTIAL/PLATELET
Abs Immature Granulocytes: 0.05 10*3/uL (ref 0.00–0.07)
Basophils Absolute: 0 10*3/uL (ref 0.0–0.1)
Basophils Relative: 0 %
Eosinophils Absolute: 0 10*3/uL (ref 0.0–0.5)
Eosinophils Relative: 0 %
HCT: 40.3 % (ref 39.0–52.0)
Hemoglobin: 13.5 g/dL (ref 13.0–17.0)
Immature Granulocytes: 1 %
Lymphocytes Relative: 15 %
Lymphs Abs: 1 10*3/uL (ref 0.7–4.0)
MCH: 32.1 pg (ref 26.0–34.0)
MCHC: 33.5 g/dL (ref 30.0–36.0)
MCV: 95.7 fL (ref 80.0–100.0)
Monocytes Absolute: 0.3 10*3/uL (ref 0.1–1.0)
Monocytes Relative: 5 %
Neutro Abs: 5.5 10*3/uL (ref 1.7–7.7)
Neutrophils Relative %: 79 %
Platelets: 640 10*3/uL — ABNORMAL HIGH (ref 150–400)
RBC: 4.21 MIL/uL — ABNORMAL LOW (ref 4.22–5.81)
RDW: 10.9 % — ABNORMAL LOW (ref 11.5–15.5)
WBC: 6.9 10*3/uL (ref 4.0–10.5)
nRBC: 0 % (ref 0.0–0.2)

## 2020-01-30 LAB — BASIC METABOLIC PANEL
Anion gap: 13 (ref 5–15)
BUN: 20 mg/dL (ref 6–20)
CO2: 26 mmol/L (ref 22–32)
Calcium: 9.3 mg/dL (ref 8.9–10.3)
Chloride: 100 mmol/L (ref 98–111)
Creatinine, Ser: 0.66 mg/dL (ref 0.61–1.24)
GFR calc Af Amer: >60 mL/min (ref >60)
GFR calc non Af Amer: >60 mL/min (ref >60)
Glucose, Bld: 118 mg/dL — ABNORMAL HIGH (ref 70–99)
Potassium: 4.6 mmol/L (ref 3.5–5.1)
Sodium: 139 mmol/L (ref 135–145)

## 2020-01-30 LAB — PHOSPHORUS: Phosphorus: 4.5 mg/dL (ref 2.5–4.6)

## 2020-01-30 LAB — TSH: TSH: 0.856 u[IU]/mL (ref 0.350–4.500)

## 2020-01-30 MED ORDER — PREDNISONE 10 MG (21) PO TBPK
ORAL_TABLET | ORAL | 0 refills | Status: DC
Start: 2020-01-30 — End: 2020-11-08

## 2020-01-30 MED ORDER — GUAIFENESIN ER 600 MG PO TB12: 600.0000 mg | ORAL_TABLET | Freq: Two times a day (BID) | ORAL | 0 refills | Status: DC

## 2020-01-30 NOTE — Discharge Summary (Addendum)
Discharge Summary  Steven Golden [id number] DOB: 10/15/1995  PCP: Raina Mina., MD  Admit date: 01/24/2020 Discharge date: 01/30/2020  Time spent: 57mins  Recommendations for Outpatient Follow-up:  1. F/u with PCP within a week  for hospital discharge follow up, repeat cbc/bmp at follow up. 2. F/u with neurology Dr Brayton Caves in one week 3. F/u with allergy and asthma center Dr Neldon Mc  Discharge Diagnoses:  Active Hospital Problems   Diagnosis Date Noted  . Sepsis due to pneumonia ([place]) 01/24/2020  . Persistent asthma with acute exacerbation 01/24/2020  . Nausea, vomiting, and diarrhea 01/24/2020  . Hypokalemia 01/24/2020  . 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 Seizure disorder ([place]) 01/24/2020  . Normocytic anemia 01/24/2020  . Marijuana use 01/24/2020    Resolved Hospital Problems  No resolved problems to display.    Discharge Condition: stable  Diet recommendation: regular diet   Filed Weights   01/24/20 0219  Weight: 80 kg    History of present illness: (per admitting MD Dr Tamala Julian) PCP: Raina Mina., MD  Patient coming from: Home via EMS  Chief Complaint: Cough and shortness of breath  I have personally briefly reviewed patient's old medical records in [place]   HPI: Steven Golden is a 24 y.o. male with medical history significant of persistent asthma, Sturge-Weber, seizure disorder, and history of COVID-19 presents with complaints of progressively worsening cough and shortness of breath over the last week.  History is obtained from the patient's father due to his shortness of breath.  This is the patient's fourth visit since onset of symptoms.  Associated symptoms include fevers at home up to 102F, productive cough, wheezing, nausea, vomiting, chest pain from coughing, abdominal cramps, diarrhea, decreased p.o. intake, and anxiety.  He had been initially seen at urgent care and started on Augmentin and on subsequent visit had been given Levaquin  along with antiemetics and prednisone.  Had been alternating Tylenol and ibuprofen for fever.  He had not been able to keep any significant amount of food, liquids, or even medications down at times.  His father notes that when he gets into these coughing spells he becomes more anxious and has worsening of his breathing.  Patient denies any history of any heart issues and denies any reports of IV drug use.  He had previously gotten over COVID-19 infection approximately 5 months ago.  En route with EMS patient was given 800 mg of ibuprofen prior to arrival.  ED Course: Upon admission into the emergency department patient was seen to have a temperature of 100 F, pulse 100-1 32, respirations 23-24, blood pressure stable, and O2 saturation maintained on 2-6 L nasal cannula oxygen.  Labs significant for WBC 9.7, hemoglobin 12.1, potassium 3.3, INR 1.6, D-dimer 1.55, CRP 28.6, 169, procalcitonin 0.73, and lactic acid 1.  COVID-19 screening was negative.  UA was negative for signs of infection.  UDS positive for opiates and marijuana.  CT angiogram of the chest revealed signs of multifocal pneumonia with lower lobe predominance.  Blood cultures have been obtained.  Patient was started on empiric antibiotics of vancomycin, metronidazole, and cefepime.  TRH called to admit   Hospital Course:  Principal Problem:   Sepsis due to pneumonia [facility]) Active Problems:   Persistent asthma with acute exacerbation   Nausea, vomiting, and diarrhea   Hypokalemia   Sturge-Weber syndrome (HCC)   Seizure disorder (HCC)   Normocytic anemia   Marijuana use  Sepsis presents on admission with Multifocal pneumonia/astham exacerbation/acute hypoxic  respiratory failure - reports Seizure a week ago, then vomited, then sob/fever, was seen in the ED three times for sob and was sent home -Also reports vaping -Blood culture no growth, MRSA screening negative, SARS-CoV-2 screen negative - treated with  solumedrol,  scheduled  nebs in addition to prn nebs, mucinex, cefepime, sputum sample not acceptable for testing. -has much improved, desire to go home, he is discharged on prednsione taper (21tab dose pack), augmentin.  -F/u with pcp and allergy /asthma center   Abnormal liver function and Coagulopathy likely  from sepsis normalized  Thrombocytosis, reactive? Monitor Repeat cbc at hospital discharge follow up  Normocytic anemia; stable above 11, monitor   Hypokalemia Replaced, Normalized  mag 2.5   N/V/D Reports sick contact, "girlfriend had a stomach bug " Resolved  Sturge-Weber and seizure disorder Reports seizure a week ago led to aspiration pneumonia Carbamazepine through 13-14 I was not able to get in touch with Dr. Ballard Russell office after several attempt, I discussed case with in-house neurologist Dr. Lorraine Lax who recommend continue current seizure medication, recommend close follow-up with patient's neurology Dr Brayton Caves Patient and father made aware, they are in agreement. Seizure precaution instruction provided  UDS on presentation:  + opiates, + THC  Body mass index is 23.27 kg/m.   DVT prophylaxis while in the hospital: lovenox Code Status:full Family Communication: Father at bedside Disposition:    Dispo: The patient is from: home  Anticipated d/c is to: home   Consultants:   none  Procedures:   none  Antimicrobials:   cefepime   Discharge Exam: BP (!) 142/86 (BP Location: Right Arm)   Pulse (!) 105   Temp 97.7 F (36.5 C) (Oral)   Resp 20   Ht 6\' 1"  (1.854 m)   Wt 80 kg   SpO2 95%   BMI 23.27 kg/m   General: calm, NAD, pleasant Cardiovascular: RRR Respiratory: much improved aeration, no wheezing, no rales, no rhonchi  Discharge Instructions You were cared for by a hospitalist during your hospital stay. If you have any questions about your discharge medications or the care you received while you were in the hospital after  you are discharged, you can call the unit and asked to speak with the hospitalist on call if the hospitalist that took care of you is not available. Once you are discharged, your primary care physician will handle any further medical issues. Please note that NO REFILLS for any discharge medications will be authorized once you are discharged, as it is imperative that you return to your primary care physician (or establish a relationship with a primary care physician if you do not have one) for your aftercare needs so that they can reassess your need for medications and monitor your lab values.  Discharge Instructions    Diet general   Complete by: As directed    Discharge instructions   Complete by: As directed    Per [facility] statutes, patients with seizures are not allowed to drive until  they have been seizure-free for six months. Use caution when using heavy equipment or power tools. Avoid working on ladders or at heights. Take showers instead of baths. Ensure the water temperature is not too high on the home water heater. Do not go swimming alone. When caring for infants or small children, sit down when holding, feeding, or changing them to minimize risk of injury to the child in the event you have a seizure.   Also, Maintain good sleep hygiene. Avoid  alcohol.  -->Call 911 and bring the patient back to the ED if:  A. The seizure lasts longer than 5 minutes.  B. The patient doesn't awaken shortly after the seizure C. The patient has new problems such as difficulty seeing, speaking or moving D. The patient was injured during the seizure E. The patient has a temperature over 102 F (39C) F. The patient vomited and now is having trouble breathing   Increase activity slowly   Complete by: As directed      Allergies as of 01/30/2020      Reactions   Azithromycin Other (See Comments)   Seizure  medication interaction      Medication List    STOP taking these medications   predniSONE 20 MG tablet Commonly known as: DELTASONE Replaced by: predniSONE 10 MG (21) Tbpk tablet     TAKE these medications   albuterol 108 (90 Base) MCG/ACT inhaler Commonly known as: VENTOLIN HFA Can inhale two puffs every four to six hours as needed for cough or wheeze. What changed: See the new instructions.   amoxicillin-clavulanate 500-125 MG tablet Commonly known as: AUGMENTIN Take 1 tablet by mouth 3 (three) times daily.   Carbatrol 300 MG 12 hr capsule Generic drug: carbamazepine Take 600-900 mg by mouth See admin instructions. Take 2 capsules in the morning and take 3 capsules at night.   guaiFENesin 600 MG 12 hr tablet Commonly known as: MUCINEX Take 1 tablet (600 mg total) by mouth 2 (two) times daily.   ibuprofen 200 MG tablet Commonly known as: ADVIL Take 400-600 mg by mouth daily as needed for fever or moderate pain.   ipratropium-albuterol 0.5-2.5 (3) MG/3ML Soln Commonly known as: DUONEB Can use one vial in the nebulizer every 4-6 hours if needed for cough or wheeze.   levalbuterol 1.25 MG/3ML nebulizer solution Commonly known as: XOPENEX Inhale 3 mLs into the lungs every 4 (four) hours as needed for shortness of breath or wheezing.   levocetirizine 5 MG tablet Commonly known as: XYZAL Take 5 mg by mouth daily as needed for allergies.   montelukast 10 MG tablet Commonly known as: Singulair Take one tablet once daily as directed.   ondansetron 4 MG tablet Commonly known as: ZOFRAN Take 4-8 mg by mouth every 8 (eight) hours as needed for nausea/vomiting.   predniSONE 10 MG (21) Tbpk tablet Commonly known as: STERAPRED UNI-PAK 21 TAB Label  & dispense according to the schedule below. 6 Pills PO on day one then, 5 Pills PO on day two, 4 Pills PO on day three, 3Pills PO on day four, 2 Pills PO on day five, 1 Pills PO on day six,  then STOP.  Total of 21 tabs Replaces:  predniSONE 20 MG tablet   promethazine 12.5 MG tablet Commonly known as: PHENERGAN Take 1 tablet by mouth every 12 (twelve) hours as needed for nausea/vomiting.   Symbicort 160-4.5 MCG/ACT inhaler Generic drug: budesonide-formoterol Inhale two puffs twice daily to prevent cough or wheeze.  Rinse, gargle, and spit after use.      Allergies  Allergen Reactions  . Azithromycin Other (See Comments)    Seizure medication interaction   Follow-up Information    Raina Mina., MD Follow up in 1 week(s).   Specialty: Internal Medicine Why: Hospital discharge follow-up, repeat basic lab works including CBC /BMP at follow-up Contact information: [place] [address] [phone]        Popli, Gillermo Murdoch, MD Follow up in  1 week(s).   Specialty: Pediatric Neurology Why: For seizure management Contact information: [place] [address] [phone]            The results of significant diagnostics from this hospitalization (including imaging, microbiology, ancillary and laboratory) are listed below for reference.    Significant Diagnostic Studies: DG Chest 2 View  Result Date: 01/24/2020 CLINICAL DATA:  Chest pain, shortness of breath EXAM: CHEST - 2 VIEW COMPARISON:  Radiograph 01/22/2020, CT 01/21/2020 FINDINGS: Diffuse fine reticular opacities throughout the lungs without focal consolidation, pneumothorax or effusion. The cardiomediastinal contours are unremarkable. No acute osseous or soft tissue abnormality. IMPRESSION: Diffuse fine reticular opacities throughout the lungs could reflect compatible with sequela of known pneumonia though are more pronounced than on comparison exam from 01/22/2020. Cannot exclude superimposed edema or recrudescence of infection. Electronically Signed   By: Lovena Le M.D.   On: 01/24/2020 03:17   CT Angio Chest PE W and/or Wo Contrast  Result Date: 01/24/2020 CLINICAL DATA:  Chest pain,  shortness of breath, on antibiotics for pneumonia EXAM: CT ANGIOGRAPHY CHEST WITH CONTRAST TECHNIQUE: Multidetector CT imaging of the chest was performed using the standard protocol during bolus administration of intravenous contrast. Multiplanar CT image reconstructions and MIPs were obtained to evaluate the vascular anatomy. CONTRAST:  190mL OMNIPAQUE IOHEXOL 350 MG/ML SOLN COMPARISON:  Chest radiographs dated 01/24/2020. [place] CTA chest dated 01/21/2020. FINDINGS: Cardiovascular:  No evidence of pulmonary embolism. No evidence of thoracic aortic aneurysm or dissection. The heart is normal in size.  No pericardial effusion. Mediastinum/Nodes: No suspicious mediastinal lymphadenopathy. Visualized thyroid is unremarkable. Lungs/Pleura: Mild diffuse/multifocal ground-glass opacity in the lungs bilaterally, with subpleural sparing. Mild superimposed patchy opacity in the bilateral lower lobes. This appearance favors multifocal pneumonia, most commonly bacterial pneumonia, although atypical/viral etiologies are possible. Notably, this appearance is not characteristic for COVID. Overall appearance/distribution is similar to the prior, although possibly minimally progressive. No suspicious pulmonary nodules. No pleural effusion or pneumothorax. Upper Abdomen: Visualized upper abdomen is within normal limits. Musculoskeletal: Visualized osseous structures are within normal limits. Review of the MIP images confirms the above findings. IMPRESSION: No evidence of pulmonary embolism. Multifocal ground-glass opacities favor multifocal pneumonia with slight lower lobe predominance. This appearance is not characteristic for COVID. Possible slight progression from recent CTA chest. Electronically Signed   By: Julian Hy M.D.   On: 01/24/2020 06:20   DG Chest Port 1 View  Result Date: 01/27/2020 CLINICAL DATA:  Pneumonia EXAM: PORTABLE CHEST 1 VIEW COMPARISON:  Jan 26, 2020. FINDINGS: There is patchy  airspace opacity in both lower lung regions, essentially stable. Interstitium remains thickened. No new opacity evident. Heart size and pulmonary vascular normal. No adenopathy. No bone lesions. IMPRESSION: Stable patchy airspace opacity in both lower lung regions. Stable interstitial wall. No new opacity. Stable cardiac silhouette. Electronically Signed   By: Lowella Grip III M.D.   On: 01/27/2020 08:00   DG Chest Port 1 View  Result Date: 01/26/2020 CLINICAL DATA:  Pneumonia, cough EXAM: PORTABLE CHEST 1 VIEW COMPARISON:  01/24/2020 FINDINGS: Heart is normal size. Patchy bilateral airspace disease and diffuse interstitial prominence slightly worsened since prior study. No effusions. No acute bony abnormality. IMPRESSION: Slight worsening in diffuse interstitial prominence and patchy bilateral airspace disease. Electronically Signed   By: Rolm Baptise M.D.   On: 01/26/2020 07:24    Microbiology: Recent Results (from the past 240 hour(s))  Urine culture     Status: None   Collection  Time: 01/24/20  4:10 AM   Specimen: In/Out Cath Urine  Result Value Ref Range Status   Specimen Description IN/OUT CATH URINE  Final   Special Requests NONE  Final   Culture   Final    NO GROWTH Performed at [facility], [address], [place], [place] [address]    Report Status 01/25/2020 FINAL  Final  Blood Culture (routine x 2)     Status: None   Collection Time: 01/24/20  4:13 AM   Specimen: BLOOD  Result Value Ref Range Status   Specimen Description BLOOD RIGHT ARM  Final   Special Requests   Final    BOTTLES DRAWN AEROBIC AND ANAEROBIC Blood Culture adequate volume   Culture   Final    NO GROWTH 5 DAYS Performed at [facility], [place] [address], [place], [place] [address]    Report Status 01/29/2020 FINAL  Final  Blood Culture (routine x 2)     Status: None   Collection Time: 01/24/20  4:14 AM   Specimen: BLOOD  Result Value Ref Range Status   Specimen Description BLOOD LEFT ARM   Final   Special Requests   Final    BOTTLES DRAWN AEROBIC ONLY Blood Culture results may not be optimal due to an excessive volume of blood received in culture bottles   Culture   Final    NO GROWTH 5 DAYS Performed at [facility], [place] [address], [place], [place] [address]    Report Status 01/29/2020 FINAL  Final  SARS Coronavirus 2 by RT PCR (hospital order, performed in [facility] hospital lab) Nasopharyngeal Nasopharyngeal Swab     Status: None   Collection Time: 01/24/20  4:15 AM   Specimen: Nasopharyngeal Swab  Result Value Ref Range Status   SARS Coronavirus 2 NEGATIVE NEGATIVE Final    Comment: (NOTE) SARS-CoV-2 target nucleic acids are NOT DETECTED. The SARS-CoV-2 RNA is generally detectable in upper and lower respiratory specimens during the acute phase of infection. The lowest concentration of SARS-CoV-2 viral copies this assay can detect is 250 copies / mL. A negative result does not preclude SARS-CoV-2 infection and should not be used as the sole basis for treatment or other patient management decisions.  A negative result may occur with improper specimen collection / handling, submission of specimen other than nasopharyngeal swab, presence of viral mutation(s) within the areas targeted by this assay, and inadequate number of viral copies (<250 copies / mL). A negative result must be combined with clinical observations, patient history, and epidemiological information. Fact Sheet for Patients:   StrictlyIdeas.no Fact Sheet for Healthcare Providers: BankingDealers.co.za This test is not yet approved or cleared  by the [place] FDA and has been authorized for detection and/or diagnosis of SARS-CoV-2 by FDA under an Emergency Use Authorization (EUA).  This EUA will remain in effect (meaning this test can be used) for the duration of the COVID-19 declaration under Section 564(b)(1) of the Act, 21 U.S.C. section  360bbb-3(b)(1), unless the authorization is terminated or revoked sooner. Performed at [facility], [place] [address], [place], [place] [address]   Culture, sputum-assessment     Status: None   Collection Time: 01/25/20  5:45 AM   Specimen: Expectorated Sputum  Result Value Ref Range Status   Specimen Description EXPECTORATED SPUTUM  Final   Special Requests NONE  Final   Sputum evaluation   Final    Sputum specimen not acceptable for testing.  Please  recollect.   Gram Stain Report Called to,Read Back By and Verified With: D. ANGELITO RN, AT [id number] 01/25/20 BY Rush Landmark Performed at [facility], [place] [address] [place], [place] [address]    Report Status 01/25/2020 FINAL  Final  MRSA PCR Screening     Status: None   Collection Time: 01/26/20  4:31 PM   Specimen: Nasal Mucosa; Nasopharyngeal  Result Value Ref Range Status   MRSA by PCR NEGATIVE NEGATIVE Final    Comment:        The GeneXpert MRSA Assay (FDA approved for NASAL specimens only), is one component of a comprehensive MRSA colonization surveillance program. It is not intended to diagnose MRSA infection nor to guide or monitor treatment for MRSA infections. Performed at [facility], [place] [address] [place], [place] [address]      Labs: Basic Metabolic Panel: Recent Labs  Lab 01/24/20 0416 01/26/20 0300 01/27/20 0343 01/29/20 0334 01/30/20 0307  NA 138 138 135 136 139  K 3.3* 3.7 3.3* 4.7 4.6  CL 101 104 99 99 100  CO2 25 23 26 25 26   GLUCOSE 116* 150* 108* 127* 118*  BUN 11 13 8 14 20   CREATININE 0.97 0.68 0.72 0.72 0.66  CALCIUM 8.8* 8.7* 8.3* 8.8* 9.3  MG  --  2.0  --  2.5*  --   PHOS  --   --   --   --  4.5   Liver Function Tests: Recent Labs  Lab 01/24/20 0940 01/27/20 0343 01/29/20 0334  AST 25 42* 38  ALT 38 44 38  ALKPHOS 84 82 99  BILITOT 1.7* 1.5* 0.2*  PROT 6.8 6.3* 7.2  ALBUMIN 2.5* 2.3* 2.5*   No results for input(s): LIPASE, AMYLASE in the last 168 hours. No  results for input(s): AMMONIA in the last 168 hours. CBC: Recent Labs  Lab 01/24/20 0416 01/26/20 0300 01/27/20 0343 01/29/20 0334 01/30/20 0307  WBC 9.7 10.5 8.1 6.3 6.9  NEUTROABS  --   --   --  5.5 5.5  HGB 12.1* 11.7* 11.8* 13.3 13.5  HCT 36.2* 34.6* 35.2* 39.9 40.3  MCV 96.8 94.8 94.9 94.8 95.7  PLT 390 451* 437* 548* 640*   Cardiac Enzymes: No results for input(s): CKTOTAL, CKMB, CKMBINDEX, TROPONINI in the last 168 hours. BNP: BNP (last 3 results) No results for input(s): BNP in the last 8760 hours.  ProBNP (last 3 results) No results for input(s): PROBNP in the last 8760 hours.  CBG: No results for input(s): GLUCAP in the last 168 hours.     Signed:  Florencia Reasons MD, PhD, FACP  [facility] 01/30/2020, 10:35 AM

## 2020-01-30 NOTE — Progress Notes (Signed)
OT Cancellation and Discharge Note  Patient Details Name: Patryk Conant MRN: [id number] DOB: [birth year]   Cancelled Treatment:    Reason Eval/Treat Not Completed: Other (comment). Pt now on RA, walking in the hall independently per PT note from yesterday and conversation with pt and family. Briefly reviewed basic parameters for continuing to build activity tolerance as he fully returns to everyday activity. OT signing off at this time as all acute OT goals have been met.   Tyrone Schimke, OT Acute Rehabilitation Services Pager: [phone] Office: [phone]  01/30/2020, 9:52 AM

## 2020-02-16 ENCOUNTER — Ambulatory Visit: Payer: Commercial Managed Care - PPO | Admitting: Allergy and Immunology

## 2020-06-08 ENCOUNTER — Telehealth: Payer: Self-pay | Admitting: Allergy and Immunology

## 2020-06-08 MED ORDER — ALBUTEROL SULFATE HFA 108 (90 BASE) MCG/ACT IN AERS: INHALATION_SPRAY | RESPIRATORY_TRACT | 0 refills | Status: DC

## 2020-06-08 NOTE — Telephone Encounter (Signed)
Needs Albuterol rescue inhaler sent to [facility].

## 2020-06-08 NOTE — Telephone Encounter (Signed)
Ox[place] sent to [facility] as requested.

## 2020-08-12 ENCOUNTER — Telehealth: Payer: Self-pay | Admitting: Allergy and Immunology

## 2020-08-12 MED ORDER — ALBUTEROL SULFATE HFA 108 (90 BASE) MCG/ACT IN AERS: INHALATION_SPRAY | RESPIRATORY_TRACT | 0 refills | Status: DC

## 2020-08-12 NOTE — Telephone Encounter (Signed)
[place] sent to [facility].

## 2020-08-12 NOTE — Telephone Encounter (Signed)
Ventolin to [facility]

## 2020-08-16 ENCOUNTER — Telehealth: Payer: Self-pay | Admitting: Allergy and Immunology

## 2020-08-16 MED ORDER — ALBUTEROL SULFATE HFA 108 (90 BASE) MCG/ACT IN AERS: INHALATION_SPRAY | RESPIRATORY_TRACT | 0 refills | Status: DC

## 2020-08-16 NOTE — Telephone Encounter (Signed)
[place] would like Ventolin called in to [facility].  I did inform patient since he just picked it up two days ago that insurance will not pay for this one and he said that was fine.  He needs it and misplaced the one he picked up two days ago.

## 2020-08-16 NOTE — Telephone Encounter (Signed)
Sent in rx for albuterol.

## 2020-08-16 NOTE — Telephone Encounter (Signed)
Attempted to contact patient but was unable. His home phone did not have answering machine set up, and his cell phone number has been changed/disconnected/or is no longer in service. If patient returns call, please inform him replacement inhaler was sent in, but for any future refill, he will HAVE to come in for a follow up visit.

## 2020-08-23 NOTE — Telephone Encounter (Signed)
Still no answer

## 2020-08-26 NOTE — Telephone Encounter (Signed)
Tried calling- number invalid.

## 2020-08-26 NOTE — Telephone Encounter (Signed)
Unable to leave message on home number. Cell number invalid.

## 2020-10-30 ENCOUNTER — Other Ambulatory Visit: Payer: Self-pay | Admitting: Allergy and Immunology

## 2020-11-04 ENCOUNTER — Other Ambulatory Visit: Payer: Self-pay | Admitting: *Deleted

## 2020-11-04 MED ORDER — ALBUTEROL SULFATE HFA 108 (90 BASE) MCG/ACT IN AERS: INHALATION_SPRAY | RESPIRATORY_TRACT | 0 refills | Status: DC

## 2020-11-08 ENCOUNTER — Other Ambulatory Visit: Payer: Self-pay

## 2020-11-08 ENCOUNTER — Ambulatory Visit (INDEPENDENT_AMBULATORY_CARE_PROVIDER_SITE_OTHER): Payer: Commercial Managed Care - PPO | Admitting: Allergy and Immunology

## 2020-11-08 VITALS — BP 112/74 | HR 88 | Resp 16 | Ht 70.7 in | Wt 138.2 lb

## 2020-11-08 DIAGNOSIS — J4550 Severe persistent asthma, uncomplicated: Secondary | ICD-10-CM

## 2020-11-08 DIAGNOSIS — J3089 Other allergic rhinitis: Secondary | ICD-10-CM | POA: Diagnosis not present

## 2020-11-08 MED ORDER — SYMBICORT 160-4.5 MCG/ACT IN AERO: INHALATION_SPRAY | RESPIRATORY_TRACT | 5 refills | Status: DC

## 2020-11-08 NOTE — Progress Notes (Signed)
[place] [facility] [place] [facility] [place]   Follow-up Note  Referring Provider: Raina Mina., MD Primary Provider: Raina Mina., MD  Date of Office Visit: 11/08/2020  Subjective:   Steven Golden (DOB: Nov 01, 1995) is a 25 y.o. male who returns to the [facility] [place] [place] on 11/08/2020 in re-evaluation of the following:  HPI: Steven Golden returns to this clinic in reevaluation of asthma and allergic rhinitis. His last visit to this clinic was 11/19/2019.  He has done relatively well since his last visit while intermittently using some Symbicort.  His requirement for short acting bronchodilator averages out to 1-3 times per week.  It does not sound as though he has required a systemic steroid to treat an exacerbation of asthma.  He is now working in a [employer] and is performing pretty hard work without much difficulty.  He has had very little issues with his nose at this point.  The issue for Steven Golden is that he is coming into a spring pollination season.  He always does a little bit worse regarding both his upper and lower airway throughout the spring.  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  He has not received any Covid vaccines but was infected with Covid in 2020 with a relatively mild illness without any long-term sequela.  He does not receive the flu vaccine.  Allergies as of 11/08/2020      Reactions   Azithromycin Other (See Comments)   Seizure medication interaction      Medication List    albuterol 108 (90 Base) MCG/ACT inhaler Commonly known as: VENTOLIN HFA Can inhale two puffs every four to six hours as needed for cough or wheeze.   Carbatrol 300 MG 12 hr capsule Generic drug: carbamazepine Take 600-900 mg by mouth See admin instructions. Take 2 capsules in the morning and take 3 capsules at night.    ipratropium-albuterol 0.5-2.5 (3) MG/3ML Soln Commonly known as: DUONEB Can use one vial in the nebulizer every 4-6 hours if needed for cough or wheeze.   levocetirizine 5 MG tablet Commonly known as: XYZAL Take 5 mg by mouth daily as needed for allergies.   Symbicort 160-4.5 MCG/ACT inhaler Generic drug: budesonide-formoterol Inhale two puffs twice daily to prevent cough or wheeze.  Rinse, gargle, and spit after use.       Past Medical History:  Diagnosis Date  . Asthma   . Seizures ([place])     No past surgical history on file.  Review of systems negative except as noted in HPI / PMHx or noted below:  Review of Systems  Constitutional: Negative.   HENT: Negative.   Eyes: Negative.   Respiratory: Negative.   Cardiovascular: Negative.   Gastrointestinal: Negative.   Genitourinary: Negative.   Musculoskeletal: Negative.   Skin: Negative.   Neurological: Negative.   Endo/Heme/Allergies: Negative.   Psychiatric/Behavioral: Negative.      Objective:   Vitals:   11/08/20 0813  BP: 112/74  Pulse: 88  Resp: 16  SpO2: 98%   Height: 5' 10.7" (179.6 cm)  Weight: 138 lb 3.2 oz (62.7 kg)   Physical Exam Constitutional:      Appearance: He is not diaphoretic.  HENT:     Head: Normocephalic.     Right Ear: Tympanic membrane, ear canal and external ear normal.     Left  Ear: Tympanic membrane, ear canal and external ear normal.     Nose: Nose normal. No mucosal edema or rhinorrhea.     Mouth/Throat:     Mouth: Oropharynx is clear and moist and mucous membranes are normal.     Pharynx: Uvula midline. No oropharyngeal exudate.  Eyes:     Conjunctiva/sclera: Conjunctivae normal.  Neck:     Thyroid: No thyromegaly.     Trachea: Trachea normal. No tracheal tenderness or tracheal deviation.  Cardiovascular:     Rate and Rhythm: Normal rate and regular rhythm.     Heart sounds: Normal heart sounds, S1 normal and S2 normal. No murmur heard.   Pulmonary:     Effort:  No respiratory distress.     Breath sounds: Normal breath sounds. No stridor. No wheezing or rales.     Comments: Narrow AP diameter Musculoskeletal:        General: No edema.  Lymphadenopathy:     Head:     Right side of head: No tonsillar adenopathy.     Left side of head: No tonsillar adenopathy.     Cervical: No cervical adenopathy.  Skin:    Findings: No erythema or rash.     Nails: There is no clubbing.  Neurological:     Mental Status: He is alert.     Diagnostics:    Spirometry was performed and demonstrated an FEV1 of 2.12 at 45 % of predicted.  Assessment and Plan:   1. Asthma, severe persistent, well-controlled   2. Other allergic rhinitis     1.  Can restart Symbicort 160-2 inhalations 1-2 times a day with spacer  2.  Can restart OTC Nasacort - 1-2 spray each nostril 1 time a day  3.  If needed: Albuterol HFA-2 inhalations every 4-6 hours  4.  If needed: DuoNeb every 4-6 hours  5.  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  I have encouraged him to use Symbicort on a relatively regular basis in the hope of preventing him from developing significant inflammation as he goes through this upcoming spring season.  The same can be said for his nose with the use of a nasal steroid.  I will see him back in his clinic in 6 months or earlier if there is a problem.  Allena Katz, MD Allergy / Immunology [place]

## 2020-11-08 NOTE — Patient Instructions (Addendum)
  1.  Can restart Symbicort 160-2 inhalations 1-2 times a day with spacer  2.  Can restart OTC Nasacort - 1-2 spray each nostril 1 time a day  3.  If needed: Albuterol HFA-2 inhalations every 4-6 hours  4.  If needed: DuoNeb every 4-6 hours  5.  Return in 6 months or earlier if problem

## 2020-11-09 ENCOUNTER — Encounter: Payer: Self-pay | Admitting: Allergy and Immunology

## 2020-11-27 ENCOUNTER — Other Ambulatory Visit: Payer: Self-pay | Admitting: Allergy and Immunology

## 2020-11-30 ENCOUNTER — Other Ambulatory Visit: Payer: Self-pay

## 2020-11-30 ENCOUNTER — Telehealth: Payer: Self-pay | Admitting: Allergy and Immunology

## 2020-11-30 MED ORDER — ALBUTEROL SULFATE HFA 108 (90 BASE) MCG/ACT IN AERS: INHALATION_SPRAY | RESPIRATORY_TRACT | 1 refills | Status: DC

## 2020-11-30 NOTE — Telephone Encounter (Signed)
Refill for [facility]buterol inhaler sent [facility] to [facility] on [address]. Unable to inform patient. Voicemail full.

## 2020-11-30 NOTE — Telephone Encounter (Signed)
Patient called and needs to have [facility] [phone].

## 2020-12-16 ENCOUNTER — Other Ambulatory Visit: Payer: Self-pay | Admitting: *Deleted

## 2020-12-16 MED ORDER — ALBUTEROL SULFATE HFA 108 (90 BASE) MCG/ACT IN AERS: 2.0000 | INHALATION_SPRAY | RESPIRATORY_TRACT | 1 refills | Status: DC | PRN

## 2020-12-21 ENCOUNTER — Telehealth: Payer: Self-pay | Admitting: Allergy and Immunology

## 2020-12-21 ENCOUNTER — Other Ambulatory Visit: Payer: Self-pay | Admitting: *Deleted

## 2020-12-21 MED ORDER — ALBUTEROL SULFATE HFA 108 (90 BASE) MCG/ACT IN AERS: 2.0000 | INHALATION_SPRAY | RESPIRATORY_TRACT | 1 refills | Status: DC | PRN

## 2020-12-21 NOTE — Telephone Encounter (Signed)
Patent called and needs to have [facility] [address] [phone].

## 2020-12-21 NOTE — Telephone Encounter (Signed)
[facility]

## 2021-01-11 ENCOUNTER — Telehealth: Payer: Self-pay | Admitting: Allergy and Immunology

## 2021-01-11 ENCOUNTER — Other Ambulatory Visit: Payer: Self-pay

## 2021-01-11 MED ORDER — ALBUTEROL SULFATE HFA 108 (90 BASE) MCG/ACT IN AERS: 2.0000 | INHALATION_SPRAY | RESPIRATORY_TRACT | 1 refills | Status: DC | PRN

## 2021-01-11 NOTE — Telephone Encounter (Signed)
[facility] [address] and patient was informed

## 2021-01-11 NOTE — Telephone Encounter (Signed)
Patient called and needs to have [facility]erol inhal[facility] [phone].

## 2021-02-14 ENCOUNTER — Telehealth: Payer: Self-pay | Admitting: Allergy and Immunology

## 2021-02-14 MED ORDER — ALBUTEROL SULFATE HFA 108 (90 BASE) MCG/ACT IN AERS: INHALATION_SPRAY | RESPIRATORY_TRACT | 0 refills | Status: DC

## 2021-02-14 NOTE — Telephone Encounter (Signed)
Called and informed patient of Dr. Bruna Potter message.  [facility].

## 2021-02-14 NOTE — Telephone Encounter (Signed)
Patient has called for a refill on his albuterol inhaler each month since March.  Please advise if ok to refill inhaler again.

## 2021-02-14 NOTE — Telephone Encounter (Signed)
Patient is requesting a refill for his Albuterol inhaler.   Best Pharmacy-  [id number] - [place], [place]

## 2021-04-27 ENCOUNTER — Telehealth: Payer: Self-pay | Admitting: Allergy and Immunology

## 2021-04-27 MED ORDER — ALBUTEROL SULFATE HFA 108 (90 BASE) MCG/ACT IN AERS: INHALATION_SPRAY | RESPIRATORY_TRACT | 0 refills | Status: DC

## 2021-04-27 NOTE — Telephone Encounter (Signed)
Pt has f/u appt scheduled 9/12. Sent in a refill.

## 2021-04-27 NOTE — Telephone Encounter (Signed)
Patient is requesting a refill on his albuterol inhaler.   Best pharmacy- [facility] on [facility]

## 2021-05-16 ENCOUNTER — Encounter: Payer: Self-pay | Admitting: Allergy and Immunology

## 2021-05-16 ENCOUNTER — Other Ambulatory Visit: Payer: Self-pay

## 2021-05-16 ENCOUNTER — Ambulatory Visit (INDEPENDENT_AMBULATORY_CARE_PROVIDER_SITE_OTHER): Payer: Commercial Managed Care - PPO | Admitting: Allergy and Immunology

## 2021-05-16 VITALS — BP 110/62 | HR 83 | Resp 16

## 2021-05-16 DIAGNOSIS — J3089 Other allergic rhinitis: Secondary | ICD-10-CM

## 2021-05-16 DIAGNOSIS — J4550 Severe persistent asthma, uncomplicated: Secondary | ICD-10-CM | POA: Diagnosis not present

## 2021-05-16 MED ORDER — SYMBICORT 160-4.5 MCG/ACT IN AERO: INHALATION_SPRAY | RESPIRATORY_TRACT | 5 refills | Status: DC

## 2021-05-16 NOTE — Progress Notes (Signed)
[place] [facility] [place] [facility] [place]   Follow-up Note  Referring Provider: Raina Mina., MD Primary Provider: Raina Mina., MD Date of Office Visit: 05/16/2021  Subjective:   Steven Golden (DOB: 07/07/1996) is a 25 y.o. male who returns to the [facility] on 05/16/2021 in re-evaluation of the following:  HPI: Bryen returns to this clinic in evaluation of asthma and allergic rhinitis.  His last visit to this clinic was 08 November 2020.  Overall he has done relatively well since his last visit without the need for systemic steroid or an antibiotic to treat any type of airway issue.  He still uses a short acting bronchodilator on a daily basis.  He still uses Symbicort only 1 time per day which is actually a very good improvement as he was rarely using this medication.  He has had very little problem with his upper airway.  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  Allergies as of 05/16/2021       Reactions   Azithromycin Other (See Comments)   Seizure medication interaction        Medication List    albuterol 108 (90 Base) MCG/ACT inhaler Commonly known as: ProAir HFA Can inhale two puffs every four to six hours as needed for cough or wheeze.   Carbatrol 300 MG 12 hr capsule Generic drug: carbamazepine Take 600-900 mg by mouth See admin instructions. Take 2 capsules in the morning and take 3 capsules at night.   ipratropium-albuterol 0.5-2.5 (3) MG/3ML Soln Commonly known as: DUONEB Can use one vial in the nebulizer every 4-6 hours if needed for cough or wheeze.   levocetirizine 5 MG tablet Commonly known as: XYZAL Take 5 mg by mouth daily as needed for allergies.   Symbicort 160-4.5 MCG/ACT inhaler Generic drug: budesonide-formoterol Inhale two puffs one to two times daily as directed to prevent cough or wheeze.  Rinse, gargle, and spit  after use.    Past Medical History:  Diagnosis Date   Asthma    Seizures ([place])     History reviewed. No pertinent surgical history.  Review of systems negative except as noted in HPI / PMHx or noted below:  Review of Systems  Constitutional: Negative.   HENT: Negative.    Eyes: Negative.   Respiratory: Negative.    Cardiovascular: Negative.   Gastrointestinal: Negative.   Genitourinary: Negative.   Musculoskeletal: Negative.   Skin: Negative.   Neurological: Negative.   Endo/Heme/Allergies: Negative.   Psychiatric/Behavioral: Negative.      Objective:   Vitals:   05/16/21 0812  BP: 110/62  Pulse: 83  Resp: 16  SpO2: 98%          Physical Exam Constitutional:      Appearance: He is not diaphoretic.  HENT:     Head: Normocephalic.     Right Ear: Tympanic membrane, ear canal and external ear normal.     Left Ear: Tympanic membrane, ear canal and external ear normal.     Nose: Nose normal. No mucosal edema or rhinorrhea.     Mouth/Throat:     Pharynx: Uvula midline. No oropharyngeal exudate.  Eyes:     Conjunctiva/sclera: Conjunctivae normal.  Neck:     Thyroid: No thyromegaly.     Trachea: Trachea normal. No tracheal tenderness or tracheal deviation.  Cardiovascular:     Rate and Rhythm: Normal rate  and regular rhythm.     Heart sounds: Normal heart sounds, S1 normal and S2 normal. No murmur heard. Pulmonary:     Effort: No respiratory distress.     Breath sounds: Normal breath sounds. No stridor. No wheezing or rales.  Lymphadenopathy:     Head:     Right side of head: No tonsillar adenopathy.     Left side of head: No tonsillar adenopathy.     Cervical: No cervical adenopathy.  Skin:    Findings: No erythema or rash.     Nails: There is no clubbing.  Neurological:     Mental Status: He is alert.    Diagnostics:    Spirometry was performed and demonstrated an FEV1 of 1.89 at 40 % of predicted.  Assessment and Plan:   1. Asthma, severe  persistent, well-controlled   2. Other allergic rhinitis     1.  Continue Symbicort 160-2 inhalations 1-2 times a day with spacer  2.  Can restart OTC Nasacort - 1-2 spray each nostril 1 time a day  3.  If needed: Albuterol HFA-2 inhalations every 4-6 hours  4.  If needed: DuoNeb every 4-6 hours  5.  Return in 6 months or earlier if problem   6.  Obtain fall flu vaccine  Leven appears to be doing relatively well at this point in time and I am going to have him continue on a controller agent using Symbicort and have encouraged him to utilize this agent twice a day.  I am actually very happy that he is using this agent at least 1 time per day on a pretty consistent basis as in the past he has been not very consistent about using any type of controller agent.  Assuming he does well with this plan I will see him back in this clinic in 6 months or earlier if there is a problem.  Allena Katz, MD Allergy / Immunology [place]

## 2021-05-16 NOTE — Patient Instructions (Signed)
  1.  Continue Symbicort 160-2 inhalations 1-2 times a day with spacer  2.  Can restart OTC Nasacort - 1-2 spray each nostril 1 time a day  3.  If needed: Albuterol HFA-2 inhalations every 4-6 hours  4.  If needed: DuoNeb every 4-6 hours  5.  Return in 6 months or earlier if problem   6.  Obtain fall flu vaccine

## 2021-05-17 ENCOUNTER — Encounter: Payer: Self-pay | Admitting: Allergy and Immunology

## 2021-05-28 ENCOUNTER — Other Ambulatory Visit: Payer: Self-pay | Admitting: Allergy and Immunology

## 2021-08-03 ENCOUNTER — Other Ambulatory Visit: Payer: Self-pay | Admitting: *Deleted

## 2021-08-03 MED ORDER — ALBUTEROL SULFATE HFA 108 (90 BASE) MCG/ACT IN AERS: INHALATION_SPRAY | RESPIRATORY_TRACT | 1 refills | Status: DC

## 2021-08-28 ENCOUNTER — Other Ambulatory Visit: Payer: Self-pay | Admitting: Allergy and Immunology

## 2021-09-08 ENCOUNTER — Telehealth: Payer: Self-pay | Admitting: Allergy and Immunology

## 2021-09-08 MED ORDER — ALBUTEROL SULFATE HFA 108 (90 BASE) MCG/ACT IN AERS: INHALATION_SPRAY | RESPIRATORY_TRACT | 1 refills | Status: DC

## 2021-09-08 NOTE — Telephone Encounter (Signed)
Patient is requesting a refill on his albuterol inhaler.   Best pharmacy- [facility]

## 2021-09-08 NOTE — Telephone Encounter (Signed)
Refill has been sent in to [facility]

## 2021-10-09 ENCOUNTER — Other Ambulatory Visit: Payer: Self-pay | Admitting: Allergy and Immunology

## 2021-11-14 ENCOUNTER — Ambulatory Visit (INDEPENDENT_AMBULATORY_CARE_PROVIDER_SITE_OTHER): Payer: Commercial Managed Care - PPO | Admitting: Allergy and Immunology

## 2021-11-14 ENCOUNTER — Other Ambulatory Visit: Payer: Self-pay

## 2021-11-14 ENCOUNTER — Encounter: Payer: Self-pay | Admitting: Allergy and Immunology

## 2021-11-14 VITALS — BP 108/62 | HR 87 | Resp 16 | Ht 70.7 in | Wt 136.8 lb

## 2021-11-14 DIAGNOSIS — J4551 Severe persistent asthma with (acute) exacerbation: Secondary | ICD-10-CM

## 2021-11-14 DIAGNOSIS — F1290 Cannabis use, unspecified, uncomplicated: Secondary | ICD-10-CM

## 2021-11-14 DIAGNOSIS — J3089 Other allergic rhinitis: Secondary | ICD-10-CM

## 2021-11-14 MED ORDER — METHYLPREDNISOLONE ACETATE 80 MG/ML IJ SUSP
80.0000 mg | Freq: Once | INTRAMUSCULAR | Status: AC
  Administered 2021-11-14: 80 mg via INTRAMUSCULAR

## 2021-11-14 MED ORDER — ALBUTEROL SULFATE HFA 108 (90 BASE) MCG/ACT IN AERS: INHALATION_SPRAY | RESPIRATORY_TRACT | 1 refills | Status: DC

## 2021-11-14 NOTE — Patient Instructions (Signed)
?  1.  Continue Symbicort 160-2 inhalations 1-2 times a day with spacer ? ?2.  Continue OTC Nasacort - 1-2 spray each nostril 1-7 times per week ? ?3.  If needed:  ? ?A. Albuterol HFA-2 inhalations every 4-6 hours ?B. DuoNeb every 4-6 hours ?C. Xyzal ? ?4.  Depo-Medrol 80 IM delivered in clinic today  ? ?5.  Return in 6 months or earlier if problem  ? ?  ?

## 2021-11-14 NOTE — Progress Notes (Unsigned)
[facility] [place] [facility] [place]   Follow-up Note  Referring Provider: Raina Mina., MD Primary Provider: Raina Mina., MD Date of Office Visit: 11/14/2021  Subjective:   Steven Golden (DOB: 1996-05-22) is a 26 y.o. male who returns to the [facility] on 11/14/2021 in re-evaluation of the following:  HPI: Steven Golden returns to this clinic in evaluation of asthma and allergic rhinitis.  His last visit to this clinic was 16 May 2021.  He continues to use his Symbicort twice a day and while doing so has a requirement for albuterol at least twice a day.  He uses his albuterol usually around the time that he smokes marijuana.  He smokes marijuana on a daily basis.  He uses a rolled paper product and he uses a wax product.  Since the pollen has arrived in the cold weather has arrived has had little bit more problems with his wheezing and coughing.  His nose has actually been doing very well.  He continues to well that his place of employment but does have a relatively good job of having good ventilation during this process.  Allergies as of 11/14/2021       Reactions   Azithromycin Other (See Comments)   Seizure medication interaction        Medication List    albuterol 108 (90 Base) MCG/ACT inhaler Commonly known as: VENTOLIN HFA Inhale two puffs every four to six hours as needed for cough or wheeze.   Carbatrol 300 MG 12 hr capsule Generic drug: carbamazepine Take 600-900 mg by mouth See admin instructions. Take 2 capsules in the morning and take 3 capsules at night.   ipratropium-albuterol 0.5-2.5 (3) MG/3ML Soln Commonly known as: DUONEB Can use one vial in the nebulizer every 4-6 hours if needed for cough or wheeze.   levocetirizine 5 MG tablet Commonly known as: XYZAL Take 5 mg by mouth daily as needed for allergies.   Symbicort 160-4.5 MCG/ACT inhaler Generic drug: budesonide-formoterol Inhale two puffs one to two  times daily as directed to prevent cough or wheeze.  Rinse, gargle, and spit after use.     Past Medical History:  Diagnosis Date   Asthma    Seizures ([place])     History reviewed. No pertinent surgical history.  Review of systems negative except as noted in HPI / PMHx or noted below:  Review of Systems  Constitutional: Negative.   HENT: Negative.    Eyes: Negative.   Respiratory: Negative.    Cardiovascular: Negative.   Gastrointestinal: Negative.   Genitourinary: Negative.   Musculoskeletal: Negative.   Skin: Negative.   Neurological: Negative.   Endo/Heme/Allergies: Negative.   Psychiatric/Behavioral: Negative.      Objective:   Vitals:   11/14/21 0835  BP: 108/62  Resp: 16   Height: 5' 10.7" (179.6 cm)  Weight: 136 lb 12.8 oz (62.1 kg)   Physical Exam Constitutional:      Appearance: He is not diaphoretic.  HENT:     Head: Normocephalic.     Right Ear: Tympanic membrane, ear canal and external ear normal.     Left Ear: Tympanic membrane, ear canal and external ear normal.     Nose: Nose normal. No mucosal edema or rhinorrhea.     Mouth/Throat:     Pharynx: Uvula midline. No oropharyngeal exudate.  Eyes:     Conjunctiva/sclera: Conjunctivae normal.  Neck:     Thyroid: No thyromegaly.  Trachea: Trachea normal. No tracheal tenderness or tracheal deviation.  Cardiovascular:     Rate and Rhythm: Normal rate and regular rhythm.     Heart sounds: Normal heart sounds, S1 normal and S2 normal. No murmur heard. Pulmonary:     Effort: No respiratory distress.     Breath sounds: Normal breath sounds. No stridor. No wheezing or rales.  Lymphadenopathy:     Head:     Right side of head: No tonsillar adenopathy.     Left side of head: No tonsillar adenopathy.     Cervical: No cervical adenopathy.  Skin:    Findings: No erythema or rash.     Nails: There is no clubbing.  Neurological:     Mental Status: He is alert.    Diagnostics:    Spirometry was  performed and demonstrated an FEV1 of 1.28 at 27 % of predicted.   Assessment and Plan:   1. Other allergic rhinitis   2. Poorly controlled severe persistent asthma with acute exacerbation   3. Marijuana smoker     1.  Continue Symbicort 160-2 inhalations 1-2 times a day with spacer  2.  Continue OTC Nasacort - 1-2 spray each nostril 1-7 times per week  3.  If needed:   A. Albuterol HFA-2 inhalations every 4-6 hours B. DuoNeb every 4-6 hours C. Xyzal  4.  Depo-Medrol 80 IM delivered in clinic today   5.  Return in 6 months or earlier if problem   I am going to give Brison a systemic steroid as he is having a significant amount of problems with his airway as he goes through this early springtime pollination season and cold weather season.  Of course, he should not have exposure to marijuana smoke and I asked him to possibly consider changing over to edibles if he is going to continue to use THC.  He has a selection of agents that he can utilize directed against respiratory tract inflammation.  Assuming he does well with this plan I will see him back in this clinic in 6 months or earlier if there is a problem.  Allena Katz, MD Allergy / Immunology [place]

## 2021-11-15 ENCOUNTER — Encounter: Payer: Self-pay | Admitting: Allergy and Immunology

## 2021-11-16 ENCOUNTER — Other Ambulatory Visit: Payer: Self-pay

## 2021-11-16 MED ORDER — SYMBICORT 160-4.5 MCG/ACT IN AERO: INHALATION_SPRAY | RESPIRATORY_TRACT | 5 refills | Status: AC

## 2021-12-12 ENCOUNTER — Other Ambulatory Visit: Payer: Self-pay | Admitting: Allergy and Immunology

## 2021-12-25 ENCOUNTER — Other Ambulatory Visit: Payer: Self-pay | Admitting: Allergy and Immunology

## 2022-01-03 ENCOUNTER — Other Ambulatory Visit: Payer: Self-pay | Admitting: Allergy and Immunology

## 2022-01-22 ENCOUNTER — Other Ambulatory Visit: Payer: Self-pay | Admitting: Allergy and Immunology

## 2022-03-09 IMAGING — DX DG CHEST 1V PORT
1 series · 1 of 1 positions shown · non-contrast
Comparison: 01/24/2020

CLINICAL DATA: Pneumonia, cough

EXAM:
PORTABLE CHEST 1 VIEW

[chest ap]
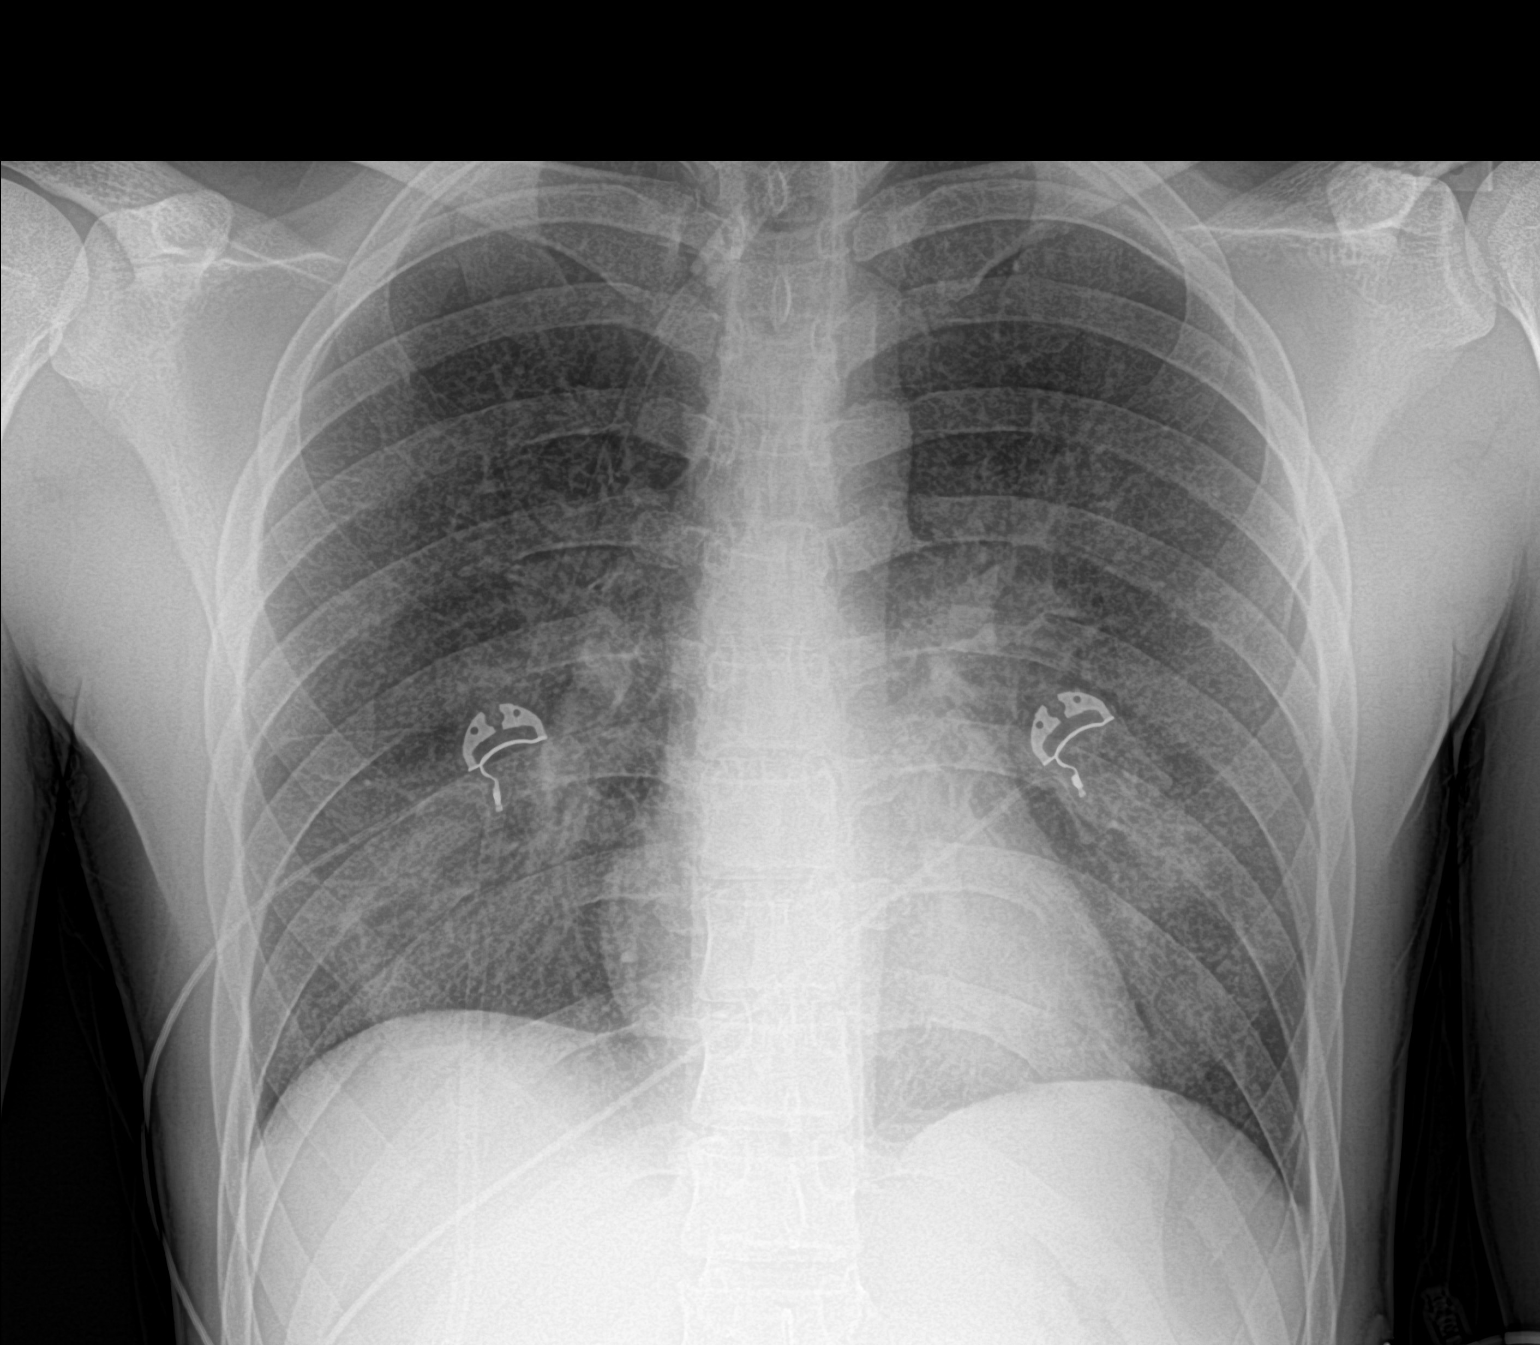

[1 of 1 positions shown; findings below may reference images not displayed]

FINDINGS: Heart is normal size. Patchy bilateral airspace disease and diffuse
interstitial prominence slightly worsened since prior study. No
effusions. No acute bony abnormality.
IMPRESSION: Slight worsening in diffuse interstitial prominence and patchy
bilateral airspace disease.

## 2022-03-10 IMAGING — DX DG CHEST 1V PORT
1 series · 1 of 1 positions shown · non-contrast
Comparison: January 26, 2020.

CLINICAL DATA: Pneumonia

EXAM:
PORTABLE CHEST 1 VIEW

[chest ap]
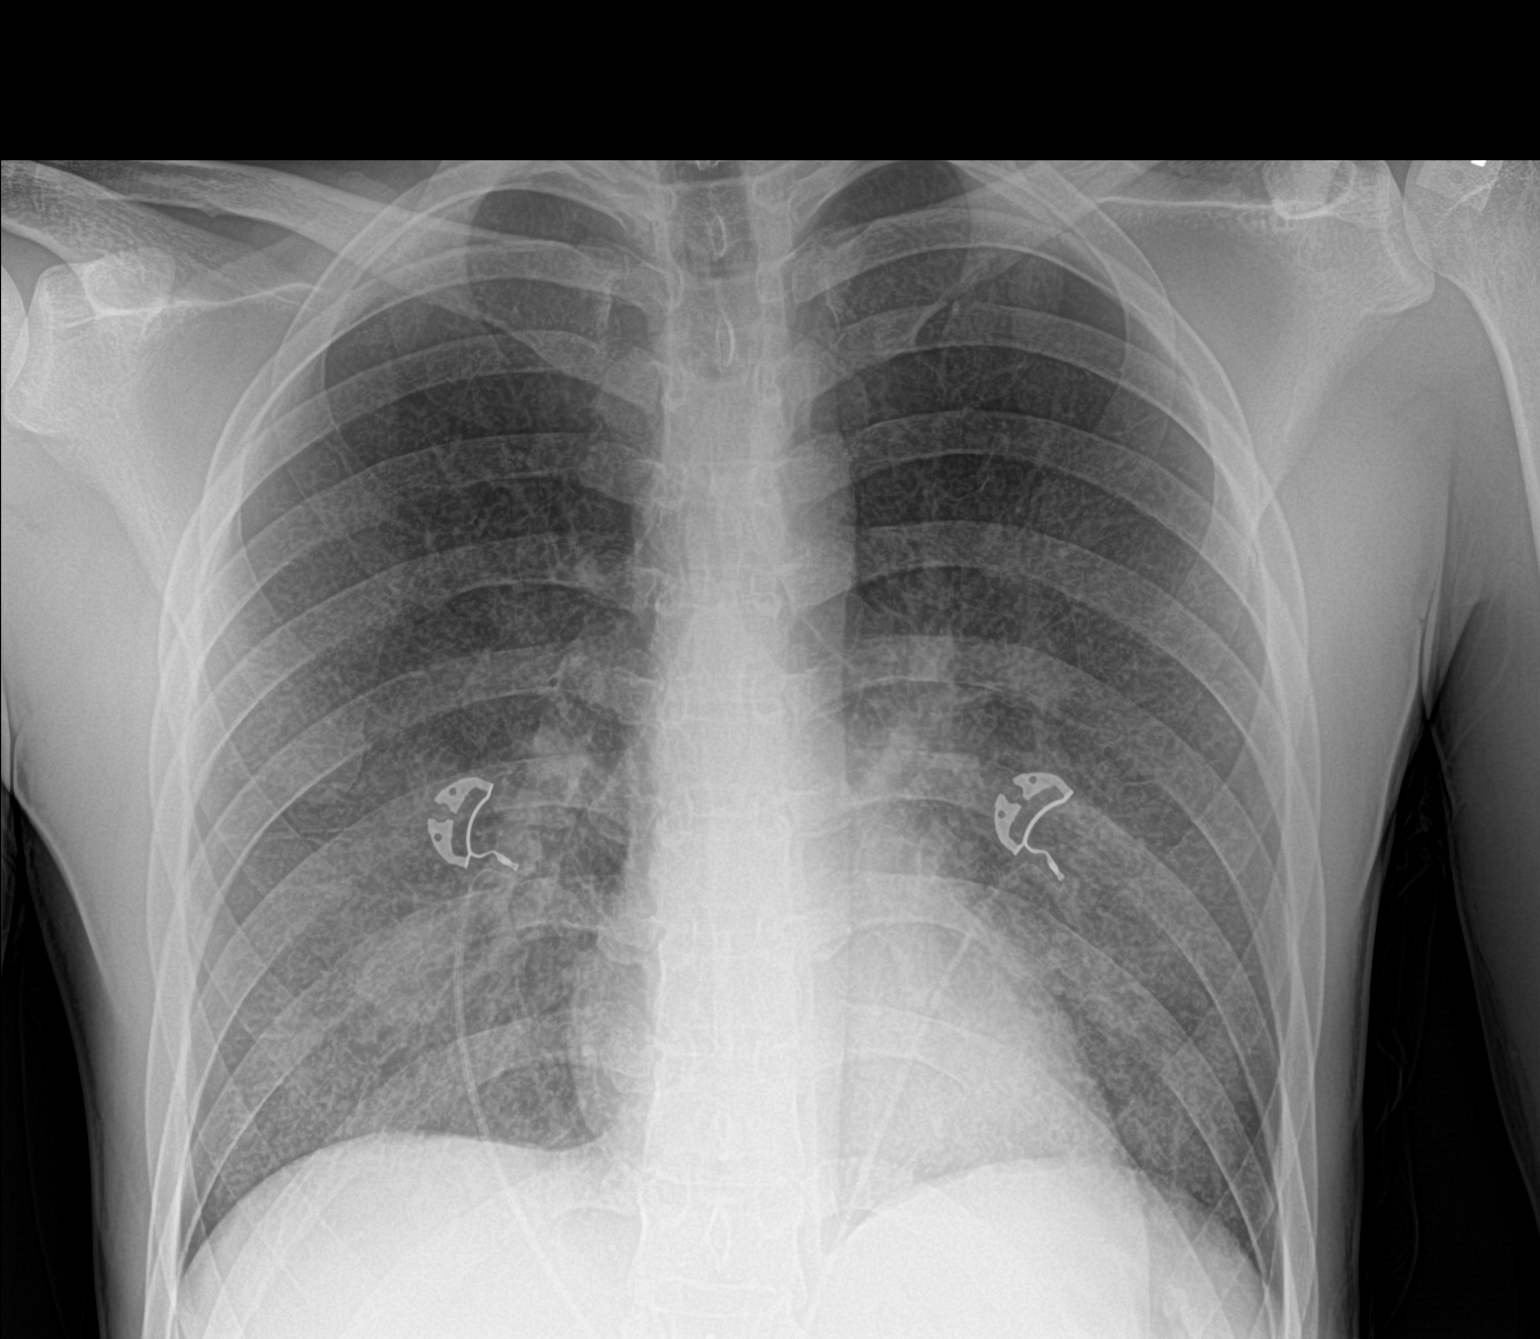

[1 of 1 positions shown; findings below may reference images not displayed]

FINDINGS: There is patchy airspace opacity in both lower lung regions,
essentially stable. Interstitium remains thickened. No new opacity
evident. Heart size and pulmonary vascular normal. No adenopathy. No
bone lesions.
IMPRESSION: Stable patchy airspace opacity in both lower lung regions. Stable
interstitial wall. No new opacity. Stable cardiac silhouette.

## 2022-03-11 ENCOUNTER — Other Ambulatory Visit: Payer: Self-pay | Admitting: Allergy and Immunology

## 2022-03-28 ENCOUNTER — Other Ambulatory Visit: Payer: Self-pay | Admitting: Allergy and Immunology

## 2022-04-10 ENCOUNTER — Other Ambulatory Visit: Payer: Self-pay | Admitting: Allergy and Immunology

## 2022-04-21 ENCOUNTER — Other Ambulatory Visit: Payer: Self-pay | Admitting: Allergy and Immunology

## 2022-05-17 ENCOUNTER — Ambulatory Visit: Payer: Commercial Managed Care - PPO | Admitting: Allergy and Immunology

## 2022-05-25 ENCOUNTER — Ambulatory Visit: Payer: Self-pay | Admitting: Allergy and Immunology

## 2022-05-25 DIAGNOSIS — J309 Allergic rhinitis, unspecified: Secondary | ICD-10-CM
